# Patient Record
Sex: Female | Born: 1970 | Race: Black or African American | Hispanic: No | Smoking: Former smoker
Health system: Southern US, Community
[De-identification: ages and names within clinical notes are randomized; demographics above are authoritative.]

## PROBLEM LIST (undated history)

## (undated) DIAGNOSIS — E119 Type 2 diabetes mellitus without complications: Secondary | ICD-10-CM

## (undated) DIAGNOSIS — F419 Anxiety disorder, unspecified: Secondary | ICD-10-CM

## (undated) DIAGNOSIS — E785 Hyperlipidemia, unspecified: Secondary | ICD-10-CM

## (undated) DIAGNOSIS — I10 Essential (primary) hypertension: Secondary | ICD-10-CM

## (undated) DIAGNOSIS — E079 Disorder of thyroid, unspecified: Secondary | ICD-10-CM

## (undated) HISTORY — DX: Anxiety disorder, unspecified: F41.9

## (undated) HISTORY — DX: Disorder of thyroid, unspecified: E07.9

## (undated) HISTORY — DX: Hyperlipidemia, unspecified: E78.5

## (undated) HISTORY — PX: BREAST EXCISIONAL BIOPSY: SUR124

## (undated) HISTORY — DX: Type 2 diabetes mellitus without complications: E11.9

## (undated) HISTORY — DX: Essential (primary) hypertension: I10

---

## 1999-10-26 HISTORY — PX: FRACTURE SURGERY: SHX138

## 2002-02-25 ENCOUNTER — Inpatient Hospital Stay (HOSPITAL_COMMUNITY): Admission: EM | Admit: 2002-02-25 | Discharge: 2002-02-26 | Payer: Self-pay | Admitting: *Deleted

## 2002-02-26 ENCOUNTER — Inpatient Hospital Stay (HOSPITAL_COMMUNITY): Admission: EM | Admit: 2002-02-26 | Discharge: 2002-03-01 | Payer: Self-pay | Admitting: Psychiatry

## 2002-04-06 ENCOUNTER — Encounter: Admission: RE | Admit: 2002-04-06 | Discharge: 2002-04-06 | Payer: Self-pay | Admitting: Psychiatry

## 2002-11-13 ENCOUNTER — Emergency Department (HOSPITAL_COMMUNITY): Admission: EM | Admit: 2002-11-13 | Discharge: 2002-11-13 | Payer: Self-pay | Admitting: Emergency Medicine

## 2002-11-13 ENCOUNTER — Encounter: Payer: Self-pay | Admitting: Emergency Medicine

## 2007-10-02 ENCOUNTER — Other Ambulatory Visit: Admission: RE | Admit: 2007-10-02 | Discharge: 2007-10-02 | Payer: Self-pay | Admitting: Obstetrics and Gynecology

## 2008-12-27 ENCOUNTER — Emergency Department (HOSPITAL_COMMUNITY): Admission: EM | Admit: 2008-12-27 | Discharge: 2008-12-27 | Payer: Self-pay | Admitting: Emergency Medicine

## 2009-07-11 ENCOUNTER — Emergency Department (HOSPITAL_COMMUNITY): Admission: EM | Admit: 2009-07-11 | Discharge: 2009-07-11 | Payer: Self-pay | Admitting: Emergency Medicine

## 2010-11-15 ENCOUNTER — Encounter: Payer: Self-pay | Admitting: Obstetrics and Gynecology

## 2011-01-07 ENCOUNTER — Other Ambulatory Visit (HOSPITAL_COMMUNITY): Payer: Self-pay | Admitting: Internal Medicine

## 2011-01-07 DIAGNOSIS — Z139 Encounter for screening, unspecified: Secondary | ICD-10-CM

## 2011-01-29 LAB — BASIC METABOLIC PANEL
BUN: 10 mg/dL (ref 6–23)
CO2: 26 mEq/L (ref 19–32)
Calcium: 9.9 mg/dL (ref 8.4–10.5)
Chloride: 102 mEq/L (ref 96–112)
Creatinine, Ser: 0.82 mg/dL (ref 0.4–1.2)
GFR calc Af Amer: >60 mL/min (ref >60)
GFR calc non Af Amer: >60 mL/min (ref >60)
Glucose, Bld: 99 mg/dL (ref 70–99)
Potassium: 3.8 mEq/L (ref 3.5–5.1)
Sodium: 138 mEq/L (ref 135–145)

## 2011-01-29 LAB — CBC
HCT: 37.7 % (ref 36.0–46.0)
Hemoglobin: 12.6 g/dL (ref 12.0–15.0)
MCHC: 33.4 g/dL (ref 30.0–36.0)
MCV: 84.4 fL (ref 78.0–100.0)
Platelets: 284 10*3/uL (ref 150–400)
RBC: 4.47 MIL/uL (ref 3.87–5.11)
RDW: 14.3 % (ref 11.5–15.5)
WBC: 10.4 10*3/uL (ref 4.0–10.5)

## 2011-01-29 LAB — DIFFERENTIAL
Lymphocytes Relative: 15 % (ref 12–46)
Lymphs Abs: 1.5 10*3/uL (ref 0.7–4.0)
Monocytes Absolute: 0.7 10*3/uL (ref 0.1–1.0)
Monocytes Relative: 6 % (ref 3–12)
Neutro Abs: 8.1 10*3/uL — ABNORMAL HIGH (ref 1.7–7.7)
Neutrophils Relative %: 77 % (ref 43–77)

## 2011-03-12 NOTE — Discharge Summary (Signed)
[facility]  Patient:    Tracy Kelley, Tracy Kelley Visit Number: [id number] MRN: [id number]          Service Type: PSY Location: 500 0500 01 Attending Physician:  Jeanice Lim Dictated by:   Jeanice Lim, M.D. Admit Date:  02/26/2002 Discharge Date: 03/01/2002                             Discharge Summary  IDENTIFYING DATA:  This is a 40 year old divorced African-American female voluntarily admitted on March 5 for depression status post intentional overdose.  This is the second hospitalization at [facility].  ADMISSION MEDICATIONS:  The patient had been off her thyroid medicine and antidepressants for at least six months due to weight gain.  She had been on Paxil and felt "spaced out."  ALLERGIES:  No known drug allergies.  PHYSICAL EXAMINATION:  GENERAL:  Essentially within normal limits.  NEUROLOGIC:  Nonfocal.  ROUTINE ADMISSION LABORATORY DATA:  Urine drug screen: Positive for amphetamines.  Potassium slightly low at 3.2.  TSH 0.527.  MENTAL STATUS EXAMINATION:  Alert, young adult African-American female, neatly dressed, cooperative.  Speech: Within normal limits.  Mood: Depressed. Affect: Sad, superficially bright. Thought process: Goal directed.  Thought content: Negative for acute dangerous ideation or psychotic symptoms. Cognitive: Intact.  Judgment and insight: Fair.  ADMITTING DIAGNOSES: Axis I:    Major depression, status post suicide attempt. Axis II:   None. Axis III:  Hypothyroidism. Axis IV:   Moderate problems with primary support group and economics. Axis V:    30/70  HOSPITAL COURSE:  The patient was admitted and ordered routine p.r.n. medications, underwent monitoring.  Potassium was replaced and she was started on Lexapro which she tolerated.  She showed improvement in mood and participated fully in individual, group, and milieu therapy, responding to clinical intervention.  CONDITION AT DISCHARGE:  Improved.  Mood: Less  depressed.  Affect: Brighter. Thought process: Goal directed.  Thought content: Negative for dangerous ideation or psychotic symptoms.  The patient reported motivation to be compliant with the followup plan.  DISCHARGE MEDICATIONS: 1. Iron 325 mg q.a.m. 2. Ambien 10 mg q.h.s. p.r.n. insomnia. 3. Lexapro 10 mg one half q.a.m.  FOLLOWUP: 1. Dr. Lourdes Sledge on May 26 at 2 p.m. 2. Abel Presto on May 21 at 10 a.m.  DISCHARGE DIAGNOSES: Axis I:    Major depression, status post suicide attempt. Axis II:   None. Axis III:  Hypothyroidism. Axis IV:   Moderate problems with primary support group and economics. Axis V:    Global assessment of functioning on discharge was 55-60. Dictated by:   Jeanice Lim, M.D. Attending Physician:  Jeanice Lim DD:  04/04/02 TD:  04/06/02 Job: [id number]

## 2011-03-12 NOTE — H&P (Signed)
[facility]  Patient:    Tracy Kelley, TOLSMA Visit Number: [id number] MRN: [id number]          Service Type: PSY Location: 500 0500 01 Attending Physician:  Jeanice Lim Dictated by:   Candi Leash. Orsini, N.P. Admit Date:  02/26/2002 Discharge Date: 03/01/2002                     Psychiatric Admission Assessment  DATE OF ADMISSION:  Feb 26, 2002  PATIENT IDENTIFICATION:  This is a 40 year old divorced African-American female voluntarily admitted on December 27, 2001, for depression and intentional overdose.  HISTORY OF PRESENT ILLNESS:  The patient presents with a history of intentional overdose of over-the-counter sleeping pills approximately 20-30 tablets, unsure what the medications name was; also less than 10 Tylenol tablets and a half a box of Benadryl at home on Sunday afternoon.  The patient did it by herself.  She thought about it for some time.  Stated that she wanted to be there alone.  States she was not doing any drinking or drug use. Her intention was "go to sleep and not wake up."  The patient left no note. The patient states that she was thinking about it, as stated, for a few days prior to the overdose.  The patient became very groggy, stated that things were not happening fast enough, then took a razor and cut both her wrists. She felt her heart race increasing, was having a panic attack.  The patient had also locked herself in her home and was unsure how she got to the emergency department.  The patient reports she has been isolating herself with decrease in activities, decreased sleep, decreased appetite.  She feels she does not need any medications at this time.  PAST PSYCHIATRIC HISTORY:  Second hospitalization to [facility]; was hospitalized in 1998 for depression.  No outpatient treatment, no other hospitalizations, no prior suicide attempt.  SUBSTANCE ABUSE HISTORY:  She is a nonsmoker.  Has been using beer lately  to help her sleep two to three a night for the past six months.  Her last drink was a week prior to this admission.  No blackouts or seizures.  The patient states she smokes marijuana on occasion.  PAST MEDICAL HISTORY:  Primary care [facility]: [facility] in [place].  Medical problems: Hypothyroidism.  MEDICATIONS:  The patient has been off her thyroid medications for at least six months due to her weight gain.  Was on Paxil and stated it got her "spaced out."  DRUG ALLERGIES:  No known allergies.  PHYSICAL EXAMINATION:  GENERAL:  Performed at [facility].  The patient appears well-nourished and in no acute distress.  LABORATORY DATA:  Urine drug screen was positive for amphetamines.  Potassium 3, BUN 5.  TSH 0.527. SOCIAL HISTORY:  She is a 40 year old divorced African-American female, divorced for five years.  Has two children ages 15 and 54.  Children are presently with her mother.  She lives with her children.  She is a Training and development officer for [employer].  She has completed one year of college, no legal problems.  FAMILY HISTORY:  Father with anxiety.  MENTAL STATUS EXAMINATION:  She is an alert, young adult African-American female.  She is neat and cooperative.  Speech is normal and relevant.  Mood is depressed.  Affect is sad; the patient tries to be bright.  Thought processes are coherent, goal directed, no evidence of psychosis.  Cognitive: Intact. Memory  is good.  Judgment is fair.  Insight is poor to fair.  The patient appears to have average intelligence.  ADMISSION DIAGNOSES: Axis I:    Major depression with suicide attempt. Axis II:   Deferred. Axis III:  Hypothyroidism. Axis IV:   Problems with primary support group and economics. Axis V:    Current is 30, estimated this past year is 24  INITIAL PLAN OF CARE:  Plan is a voluntary admission to [facility] for depression and suicide attempt.  Contract for safety.  Check  every 15 minutes.  Will obtain labs.  Will replace her potassium, encourage fluids. Have Ambien for sleep.  Attend groups.  Will monitor her lacerations for infections.  Will monitor depressive symptoms.  The patient is to be medication compliant if the patient desires to be on an antidepressant.  ESTIMATED LENGTH OF STAY:  Three to five days. Dictated by:   Candi Leash. Orsini, N.P. Attending Physician:  Jeanice Lim DD:  03/01/02 TD:  03/01/02 Job: [id number]

## 2011-03-12 NOTE — H&P (Signed)
[facility]  Patient:    ANALIS, DISTLER Visit Number: [id number] MRN: [id number]          Service Type: MED Location: ICCU [id number] Attending Physician:  Rosalyn Charters Dictated by:   Vonita Moss, M.D. Admit Date:  02/25/2002                           History and Physical  CHIEF COMPLAINT:  The patient is a 40 year old black female, who was found by family members lying on her bed lethargic and with lacerations to both wrists.  HISTORY OF PRESENT ILLNESS:  EMS was contacted and the patient was brought to the emergency room.  The patient apparently also took a number of over-the-counter sleep aids, Tylenol Sinus, and Benadryl.  The patient is currently awake and alert.  She denies any specific complaints but it is very difficult to get any information from the patient.  The patients mother is at her bedside.  Mother denies that the patient has any history of depression or any history of any prior suicide attempts, although apparently the patient told nursing staff earlier that she had a prior suicide attempt and she has had an admission to [facility] approximately three to four years ago. The patients only report at this time is that "things were piling up on her."  PAST MEDICAL HISTORY:  Hyperthyroid.  Patient states that she has not filled her medications in quite a while, perhaps as long as a year.  CURRENT MEDICATIONS:  None.  ALLERGIES:  None.  SOCIAL HISTORY:  The patient is divorced.  She lives with her two children. She works at [employer].  Currently denies tobacco, alcohol, and drug use.  FAMILY HISTORY:  Positive for mother with hypertension.  REVIEW OF SYSTEMS:  Very limited secondary to poor cooperation by patient but specifically denying chest pain, shortness of breath, nausea, vomiting, abdominal pain, leg pain or swelling, or urinary symptoms.  Her last menstrual period was the first of this month (April 2003) and was  normal.  PHYSICAL EXAMINATION:  GENERAL:  Well-developed, well-nourished female, who appears somewhat diaphoretic.  VITAL SIGNS:  Temperature 99.3 degrees, pulse 137, respirations 20, initial blood pressure 192/97.  O2 saturation 100%.  HEENT:  There is lid lag noted.  Pupils appear equal and reactive to light. Sclerae anicteric.  Conjunctivae not injected.  Extraocular movements were intact.  The patient has an NG tube in place.  Oropharynx is clear.  No oral lesions noted.  NECK:  Supple.  No JVD noted.  Thyroid not tender to palpation.  CHEST/LUNGS:  Clear.  CARDIAC:  Rhythm is regular and tachycardic.  BREAST:  Examination deferred.  ABDOMEN:  Soft.  Bowel sounds present in all four quadrants.  No rebound, guarding, or obvious organomegaly noted.  GU:  Examination deferred.  EXTREMITIES:  Distal pulses intact.  No peripheral edema noted.  NEUROLOGIC:  Nonfocal.  ANCILLARY DATA:  Acetaminophen level is 25.3.  WBC is 9.8, hemoglobin 12.7, platelets 388,000.  Sodium 137, potassium 2.9, chloride 106, CO2 21, BUN 6, glucose 138, creatinine 1.0.  Tox screen was positive for amphetamines.  EKG was sinus tachycardia, ventricular rate 137.  ASSESSMENT/PLAN:  1. Suicide attempt, overdose.  The patients wrists were bandaged in the     emergency room and she did receive tetanus vaccine.  She has had a     nasogastric tube placed and been lavaged with charcoal.  At this time  will     admit to the intensive care unit.  Follow neurologic examinations.     Recheck Tylenol level in four hours, and hydrate with normal saline.  2. History of hyperthyroidism.  Patient currently not taking any     medications per her report.  Her thyroid is not tender, although this     certainly may be contributing to her tachycardia.  Will check TSH and     T4 levels.  Will go ahead and start treatment with Propranolol.  3. Tachycardia.  Question multifactorial in this patient with history of      hyperthyroidism, not on any current medications, and a positive toxicology     screen for amphetamines.  As above, will give Propranolol for now and     hold other medications until TSH and T4 levels are back.  4. Hypokalemia.  Will supplement and follow.  5. Psychiatric.  Will have assessment team evaluate patient in the morning. Dictated by:   Vonita Moss, M.D. Attending Physician:  Rosalyn Charters DD:  02/25/02 TD:  02/26/02 Job: [id number]

## 2011-03-12 NOTE — Discharge Summary (Signed)
[facility]  Patient:    Tracy Kelley, Tracy Kelley Visit Number: [id number] MRN: [id number]          Service Type: PSY Location: 500 0500 01 Attending Physician:  Jeanice Lim Dictated by:   Renne Musca, M.D. Admit Date:  02/26/2002 Discharge Date: 03/01/2002                             Discharge Summary  DIAGNOSES: 1. Suicide attempted by overdose and laceration of wrists. 2. History of hyperthyroidism, euthyroid. 3. Anxiety disorder with depression.  DISPOSITION:  Patient transferred to [facility].  FOLLOWUP:  Dr. Kathrynn Running  HISTORY OF PRESENT ILLNESS:  The patient is a 40 year old African-American female who was found by her family lethargic and lacerations to both wrists. The patient had a previous suicide attempt about three to four years prior and was admitted to [facility].  The patient has had increasing psychosocial stressors, apparently felt it was not worth it anymore, and complained of being "so tired."  The patient apparently gained a significant amount of weight after her initial hospitalization and stopped her psychiatric medications which she could not recall at this time.  In any event, she also has a history of hyperthyroidism which was treated with medication.  No history of iodine ingestion and her TSH at this time is 0.9 which was in the normal limits with a free T4 of 1.4 which was also normal.  In any event, the patient had taken an unknown amount of Tylenol Sinus medication, Benadryl, and an over-the-counter sleep aid.  She was admitted to the intensive care unit for monitoring.  She did have some hypertension, though not hemodynamically significant, as well as sinus tachycardia, again, probably related to sympathomimetic medications she took.  At the time of discharge her heart rate was improved into the 90s with normal blood pressure.  She was evaluated by the ACT team who arranged for transfer to  [facility].  PHYSICAL EXAMINATION  GENERAL:  Alert, quiet African-American female who is in no distress. Well-developed, well-nourished.  VITAL SIGNS:  Blood pressure 130/86, pulse 90 and regular, respirations unlabored.  LUNGS:  Clear to auscultation.  ABDOMEN:  Protuberant, soft, nontender.  EXTREMITIES:  Without clubbing, cyanosis, edema.  She has some superficial excoriations of her wrists.  HEART:  Regular rate and rhythm.  No murmur, gallop, or rub.  The patients status was discussed at length with her family.  The patient will need ongoing support once she returns to the community.  The patient was discharged in stable condition. Dictated by:   Renne Musca, M.D. Attending Physician:  Jeanice Lim DD:  03/07/02 TD:  03/09/02 Job: [id number]

## 2014-05-13 ENCOUNTER — Other Ambulatory Visit (HOSPITAL_COMMUNITY): Payer: Self-pay | Admitting: Family Medicine

## 2014-05-13 DIAGNOSIS — E049 Nontoxic goiter, unspecified: Secondary | ICD-10-CM

## 2014-05-16 ENCOUNTER — Ambulatory Visit (HOSPITAL_COMMUNITY)
Admission: RE | Admit: 2014-05-16 | Discharge: 2014-05-16 | Disposition: A | Discharge status: Home / Self Care | Payer: PRIVATE HEALTH INSURANCE | Source: Ambulatory Visit | Attending: Family Medicine | Admitting: Family Medicine

## 2014-05-16 DIAGNOSIS — E049 Nontoxic goiter, unspecified: Secondary | ICD-10-CM | POA: Insufficient documentation

## 2016-10-07 ENCOUNTER — Telehealth: Payer: Self-pay

## 2016-10-07 NOTE — Telephone Encounter (Signed)
Left message to call back for new pt appt. With dr Meda Coffee.

## 2016-11-04 ENCOUNTER — Ambulatory Visit: Payer: PRIVATE HEALTH INSURANCE | Admitting: Family Medicine

## 2016-11-05 ENCOUNTER — Encounter: Payer: Self-pay | Admitting: Family Medicine

## 2016-11-05 ENCOUNTER — Ambulatory Visit (INDEPENDENT_AMBULATORY_CARE_PROVIDER_SITE_OTHER): Payer: PRIVATE HEALTH INSURANCE | Admitting: Family Medicine

## 2016-11-05 VITALS — BP 132/82 | HR 88 | Temp 98.5°F | Resp 18 | Ht 70.0 in | Wt 198.0 lb

## 2016-11-05 DIAGNOSIS — F5105 Insomnia due to other mental disorder: Secondary | ICD-10-CM

## 2016-11-05 DIAGNOSIS — I10 Essential (primary) hypertension: Secondary | ICD-10-CM | POA: Diagnosis not present

## 2016-11-05 DIAGNOSIS — E119 Type 2 diabetes mellitus without complications: Secondary | ICD-10-CM | POA: Diagnosis not present

## 2016-11-05 DIAGNOSIS — F419 Anxiety disorder, unspecified: Secondary | ICD-10-CM | POA: Insufficient documentation

## 2016-11-05 DIAGNOSIS — Z7689 Persons encountering health services in other specified circumstances: Secondary | ICD-10-CM

## 2016-11-05 DIAGNOSIS — E785 Hyperlipidemia, unspecified: Secondary | ICD-10-CM | POA: Diagnosis not present

## 2016-11-05 LAB — COMPLETE METABOLIC PANEL WITH GFR
ALT: 11 U/L (ref 6–29)
AST: 19 U/L (ref 10–30)
Albumin: 4.3 g/dL (ref 3.6–5.1)
Alkaline Phosphatase: 65 U/L (ref 33–115)
BUN: 7 mg/dL (ref 7–25)
CO2: 32 mmol/L — AB (ref 20–31)
Calcium: 10.3 mg/dL — ABNORMAL HIGH (ref 8.6–10.2)
Chloride: 97 mmol/L — ABNORMAL LOW (ref 98–110)
Creat: 0.71 mg/dL (ref 0.50–1.10)
GFR, Est African American: >89 mL/min (ref >=60)
GLUCOSE: 88 mg/dL (ref 65–99)
Potassium: 3.4 mmol/L — ABNORMAL LOW (ref 3.5–5.3)
SODIUM: 139 mmol/L (ref 135–146)
TOTAL PROTEIN: 7.5 g/dL (ref 6.1–8.1)
Total Bilirubin: 0.4 mg/dL (ref 0.2–1.2)

## 2016-11-05 LAB — CBC
HCT: 36.5 % (ref 35.0–45.0)
Hemoglobin: 12.3 g/dL (ref 11.7–15.5)
MCH: 28.5 pg (ref 27.0–33.0)
MCHC: 33.7 g/dL (ref 32.0–36.0)
MCV: 84.7 fL (ref 80.0–100.0)
MPV: 9.3 fL (ref 7.5–12.5)
PLATELETS: 331 10*3/uL (ref 140–400)
RBC: 4.31 MIL/uL (ref 3.80–5.10)
RDW: 15.1 % — ABNORMAL HIGH (ref 11.0–15.0)
WBC: 8.3 10*3/uL (ref 3.8–10.8)

## 2016-11-05 LAB — HEMOGLOBIN A1C
Hgb A1c MFr Bld: 5.6 % (ref <5.7)
Mean Plasma Glucose: 114 mg/dL

## 2016-11-05 LAB — LIPID PANEL
CHOL/HDL RATIO: 2.6 ratio (ref <5.0)
CHOLESTEROL: 131 mg/dL (ref <200)
HDL: 50 mg/dL — ABNORMAL LOW (ref >50)
LDL Cholesterol: 15 mg/dL (ref <100)
TRIGLYCERIDES: 331 mg/dL — AB (ref <150)
VLDL: 66 mg/dL — ABNORMAL HIGH (ref <30)

## 2016-11-05 LAB — TSH: TSH: 0.88 mIU/L

## 2016-11-05 MED ORDER — TEMAZEPAM 15 MG PO CAPS: 15.0000 mg | ORAL_CAPSULE | Freq: Every evening | ORAL | 0 refills | Status: DC | PRN

## 2016-11-05 MED ORDER — PRAVASTATIN SODIUM 20 MG PO TABS: 20.0000 mg | ORAL_TABLET | Freq: Every day | ORAL | 3 refills | Status: DC

## 2016-11-05 MED ORDER — AMLODIPINE BESYLATE 10 MG PO TABS
10.0000 mg | ORAL_TABLET | Freq: Every day | ORAL | 3 refills | Status: DC
Start: 2016-11-05 — End: 2017-09-05

## 2016-11-05 MED ORDER — SITAGLIPTIN PHOSPHATE 100 MG PO TABS: 100.0000 mg | ORAL_TABLET | Freq: Every day | ORAL | 3 refills | Status: DC

## 2016-11-05 MED ORDER — HYDROCHLOROTHIAZIDE 25 MG PO TABS: 25.0000 mg | ORAL_TABLET | Freq: Every day | ORAL | 3 refills | Status: DC

## 2016-11-05 NOTE — Patient Instructions (Addendum)
Need old record [facility] Need record eye exam  Medicines refilled Labs today I will send you a letter with your test results.  If there is anything of concern, we will call right away.  Try to get back into walking on regular basis  See me in six months
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
## 2016-11-06 LAB — URINALYSIS, ROUTINE W REFLEX MICROSCOPIC
BILIRUBIN URINE: NEGATIVE
GLUCOSE, UA: NEGATIVE
Hgb urine dipstick: NEGATIVE
Ketones, ur: NEGATIVE
Leukocytes, UA: NEGATIVE
Nitrite: NEGATIVE
PH: 7 (ref 5.0–8.0)
Protein, ur: NEGATIVE
SPECIFIC GRAVITY, URINE: 1.011 (ref 1.001–1.035)

## 2016-11-06 LAB — MICROALBUMIN / CREATININE URINE RATIO
Creatinine, Urine: 98 mg/dL (ref 20–320)
Microalb Creat Ratio: 3 mcg/mg creat (ref <30)
Microalb, Ur: 0.3 mg/dL

## 2016-11-06 LAB — VITAMIN D 25 HYDROXY (VIT D DEFICIENCY, FRACTURES): Vit D, 25-Hydroxy: 10 ng/mL — ABNORMAL LOW (ref 30–100)

## 2016-11-08 ENCOUNTER — Encounter: Payer: Self-pay | Admitting: Family Medicine

## 2016-11-08 MED ORDER — VITAMIN D (ERGOCALCIFEROL) 1.25 MG (50000 UNIT) PO CAPS: 50000.0000 [IU] | ORAL_CAPSULE | ORAL | 0 refills | Status: DC

## 2016-12-15 ENCOUNTER — Other Ambulatory Visit: Payer: Self-pay | Admitting: Family Medicine

## 2016-12-15 ENCOUNTER — Other Ambulatory Visit: Payer: Self-pay

## 2016-12-15 MED ORDER — TEMAZEPAM 15 MG PO CAPS: 15.0000 mg | ORAL_CAPSULE | Freq: Every evening | ORAL | 0 refills | Status: DC | PRN

## 2017-02-10 ENCOUNTER — Other Ambulatory Visit: Payer: Self-pay | Admitting: Family Medicine

## 2017-02-14 NOTE — Telephone Encounter (Signed)
Seen 1 12 18   appt 7 12 18

## 2017-02-25 ENCOUNTER — Encounter: Payer: Self-pay | Admitting: Family Medicine

## 2017-02-25 ENCOUNTER — Ambulatory Visit (INDEPENDENT_AMBULATORY_CARE_PROVIDER_SITE_OTHER): Payer: PRIVATE HEALTH INSURANCE | Admitting: Family Medicine

## 2017-02-25 VITALS — BP 118/74 | HR 104 | Temp 98.4°F | Resp 18 | Ht 70.0 in | Wt 193.1 lb

## 2017-02-25 DIAGNOSIS — L237 Allergic contact dermatitis due to plants, except food: Secondary | ICD-10-CM | POA: Diagnosis not present

## 2017-02-25 MED ORDER — METHYLPREDNISOLONE ACETATE 80 MG/ML IJ SUSP
80.0000 mg | Freq: Once | INTRAMUSCULAR | Status: AC
  Administered 2017-02-25: 80 mg via INTRAMUSCULAR

## 2017-02-25 MED ORDER — METHYLPREDNISOLONE ACETATE 80 MG/ML IJ SUSP: 80.0000 mg | Freq: Once | INTRAMUSCULAR | 0 refills | Status: DC

## 2017-02-25 MED ORDER — PREDNISONE 20 MG PO TABS: 20.0000 mg | ORAL_TABLET | Freq: Two times a day (BID) | ORAL | 0 refills | Status: DC

## 2017-02-25 NOTE — Patient Instructions (Signed)
Take the prednisone as directed Take the double dose zyrtec during the day Take the benadryl at night Continue the cortisone cream or anti itch lotion as much as you wish Call for problems

## 2017-02-25 NOTE — Progress Notes (Signed)
    Chief Complaint  Patient presents with  . Rash    since sunday   Rash on left arm, forehead and left abdomen - spreading - since doing yard work. Has used benadryl PO and cortisone cream The rash is swelling and weeping and getting bigger Previous similar reaction to poison ivy  Patient Active Problem List   Diagnosis Date Noted  . Diabetes mellitus type 2 in nonobese ([place]) 11/05/2016  . Essential hypertension 11/05/2016  . Hyperlipemia 11/05/2016  . 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 amLODipine (NORVASC) 10 MG tablet Take 1 tablet (10 mg total) by mouth daily.  . hydrochlorothiazide (HYDRODIURIL) 25 MG tablet Take 1 tablet (25 mg total) by mouth daily.  . pravastatin (PRAVACHOL) 20 MG tablet Take 1 tablet (20 mg total) by mouth daily.  . sitaGLIPtin (JANUVIA) 100 MG tablet Take 1 tablet (100 mg total) by mouth daily.  . temazepam (RESTORIL) 15 MG capsule TAKE 1 TO 2 CAPSULES BY MOUTH AT BEDTIMEAS NEEDED FOR SLEEP.  Marland Kitchen predniSONE (DELTASONE) 20 MG tablet Take 1 tablet (20 mg total) by mouth 2 (two) times daily with a meal. For one week then one a day   No facility-administered encounter medications on file as of 02/25/2017.     Allergies  Allergen Reactions  . Metformin And Related Nausea Only    GI intolerance nausea and diarreha    Review of Systems  Constitutional: Negative for activity change, appetite change and fever.  HENT: Negative for trouble swallowing.   Respiratory: Negative for chest tightness and shortness of breath.   Skin: Positive for rash.  Psychiatric/Behavioral: Positive for sleep disturbance.       Itching    BP 118/74 (BP Location: Right Arm, Patient Position: Sitting, Cuff Size: Normal)   Pulse (!) 104   Temp 98.4 F (36.9 C) (Temporal)   Resp 18   Ht 5\' 10"  (1.778 m)   Wt 193 lb 1.9 oz (87.6 kg)   LMP 02/17/2017 (Approximate)   SpO2 99%   BMI 27.71 kg/m    Physical Exam  Constitutional: She is oriented to person, place, and time. She appears well-developed and well-nourished. No distress.  HENT:  Head: Normocephalic and atraumatic.  Mouth/Throat: Oropharynx is clear and moist.  Eyes: Conjunctivae are normal. Pupils are equal, round, and reactive to light.  Neck: Normal range of motion.  Lymphadenopathy:    She has no cervical adenopathy.  Neurological: She is alert and oriented to person, place, and time.  Skin: Rash noted.  Papulovesicular rash on left forehead and forearm at the elbow.  Lower arm with erythema and soft tissue swelling, weeping serous fluid  Psychiatric: She has a normal mood and affect. Her behavior is normal.    ASSESSMENT/PLAN:  1. Allergic contact dermatitis due to plants, except food Rhus dermatitis - methylPREDNISolone acetate (DEPO-MEDROL) injection 80 mg; Inject 1 mL (80 mg total) into the muscle once.   Patient Instructions  Take the prednisone as directed Take the double dose zyrtec during the day Take the benadryl at night Continue the cortisone cream or anti itch lotion as much as you wish Call for problems    [name], MD

## 2017-03-08 ENCOUNTER — Other Ambulatory Visit: Payer: Self-pay | Admitting: Family Medicine

## 2017-05-05 ENCOUNTER — Ambulatory Visit (INDEPENDENT_AMBULATORY_CARE_PROVIDER_SITE_OTHER): Payer: PRIVATE HEALTH INSURANCE | Admitting: Family Medicine

## 2017-05-05 ENCOUNTER — Encounter: Payer: Self-pay | Admitting: Family Medicine

## 2017-05-05 ENCOUNTER — Other Ambulatory Visit: Payer: Self-pay | Admitting: Family Medicine

## 2017-05-05 VITALS — BP 130/78 | HR 96 | Temp 97.9°F | Resp 18 | Ht 70.0 in | Wt 191.1 lb

## 2017-05-05 DIAGNOSIS — I10 Essential (primary) hypertension: Secondary | ICD-10-CM | POA: Diagnosis not present

## 2017-05-05 DIAGNOSIS — E049 Nontoxic goiter, unspecified: Secondary | ICD-10-CM | POA: Diagnosis not present

## 2017-05-05 DIAGNOSIS — Z1231 Encounter for screening mammogram for malignant neoplasm of breast: Secondary | ICD-10-CM

## 2017-05-05 DIAGNOSIS — E119 Type 2 diabetes mellitus without complications: Secondary | ICD-10-CM | POA: Diagnosis not present

## 2017-05-05 DIAGNOSIS — E040 Nontoxic diffuse goiter: Secondary | ICD-10-CM

## 2017-05-05 NOTE — Patient Instructions (Addendum)
Come back within 3 months for a PAP and PE Get the ultrasound and mammogram at [facility] - these can be scheduled same day Get blood work same day as x rays Your BP is great Continue to eat well and exercise Get me a copy of your eye exam when you go

## 2017-05-05 NOTE — Progress Notes (Signed)
Chief Complaint  Patient presents with  . Follow-up    6 month   Feels well Wants to lose weight.  Discussed diet and exercise. Sugars are good BP is good Says some times her 'thyroid" hurts and points to ant neck.  She has a goiter.  Last [place] was 2015.  Will repeat TFT and ultrsound Vi d def discussed.  She took her vitamin D 50000 u a week for 12 weeks then stopped.  reminded to get OTC vitamin D 2000 u daily  Patient Active Problem List   Diagnosis Date Noted  . Goiter diffuse 05/05/2017  . Diabetes mellitus type 2 in nonobese ([place]) 11/05/2016  . Essential hypertension 11/05/2016  . Hyperlipemia 11/05/2016  . Insomnia secondary to anxiety 11/05/2016    Outpatient Encounter Prescriptions as of 05/05/2017  Medication Sig  . amLODipine (NORVASC) 10 MG tablet Take 1 tablet (10 mg total) by mouth daily.  . hydrochlorothiazide (HYDRODIURIL) 25 MG tablet Take 1 tablet (25 mg total) by mouth daily.  . pravastatin (PRAVACHOL) 20 MG tablet Take 1 tablet (20 mg total) by mouth daily.  . sitaGLIPtin (JANUVIA) 100 MG tablet Take 1 tablet (100 mg total) by mouth daily.  . temazepam (RESTORIL) 15 MG capsule TAKE 1 TO 2 CAPSULES AT BEDTIME AS NEEDED FOR SLEEP   No facility-administered encounter medications on file as of 05/05/2017.     Allergies  Allergen Reactions  . Metformin And Related Nausea Only    GI intolerance nausea and diarreha    Review of Systems  Constitutional: Negative for activity change, appetite change and unexpected weight change.  HENT: Negative for congestion, dental problem, postnasal drip and rhinorrhea.   Eyes: Negative for redness and visual disturbance.  Respiratory: Negative for cough and shortness of breath.   Cardiovascular: Negative for chest pain, palpitations and leg swelling.  Gastrointestinal: Negative for abdominal pain, constipation and diarrhea.  Genitourinary: Negative for difficulty urinating, frequency and menstrual problem.    Musculoskeletal: Positive for neck pain. Negative for arthralgias and back pain.       "Thyroid"  Neurological: Negative for dizziness and headaches.  Psychiatric/Behavioral: Positive for sleep disturbance. Negative for dysphoric mood. The patient is not nervous/anxious.     BP 130/78 (BP Location: Right Arm, Patient Position: Sitting, Cuff Size: Normal)   Pulse 96   Temp 97.9 F (36.6 C) (Temporal)   Resp 18   Ht 5\' 10"  (1.778 m)   Wt 191 lb 1.3 oz (86.7 kg)   LMP 04/27/2017 (Exact Date)   SpO2 98%   BMI 27.42 kg/m   Physical Exam  Constitutional: She is oriented to person, place, and time. She appears well-developed and well-nourished. No distress.  HENT:  Head: Normocephalic and atraumatic.  Mouth/Throat: Oropharynx is clear and moist.  Eyes: Pupils are equal, round, and reactive to light. Conjunctivae are normal.  Neck: Normal range of motion. Thyromegaly present.  Cardiovascular: Normal rate, regular rhythm and normal heart sounds.   Pulmonary/Chest: Effort normal and breath sounds normal.  Lymphadenopathy:    She has no cervical adenopathy.  Neurological: She is alert and oriented to person, place, and time. She displays normal reflexes.  Skin: Skin is warm and dry. No rash noted.  Psychiatric: She has a normal mood and affect. Her behavior is normal. Thought content normal.    ASSESSMENT/PLAN:  1. Goiter diffuse  2. Diabetes mellitus type 2 in nonobese ([facility]) - CBC - COMPLETE METABOLIC PANEL WITH GFR - Hemoglobin A1c - Lipid  panel - VITAMIN D 25 Hydroxy (Vit-D Deficiency, Fractures) - Microalbumin / creatinine urine ratio - Urinalysis, Routine w reflex microscopic - TSH - US THYROID; Future - MM Digital Screening; Future  3. Essential hypertension    Patient Instructions  Come back within 3 months for a PAP and PE Get the ultrasound and mammogram at [facility] - these can be scheduled same day Get blood work same day as x rays Your BP is  great Continue to eat well and exercise Get me a copy of your eye exam when you go   [name], MD

## 2017-05-10 ENCOUNTER — Other Ambulatory Visit (HOSPITAL_COMMUNITY): Payer: PRIVATE HEALTH INSURANCE

## 2017-05-11 ENCOUNTER — Ambulatory Visit (HOSPITAL_COMMUNITY)
Admission: RE | Admit: 2017-05-11 | Discharge: 2017-05-11 | Disposition: A | Discharge status: Home / Self Care | Payer: PRIVATE HEALTH INSURANCE | Source: Ambulatory Visit | Attending: Family Medicine | Admitting: Family Medicine

## 2017-05-11 DIAGNOSIS — E119 Type 2 diabetes mellitus without complications: Secondary | ICD-10-CM | POA: Insufficient documentation

## 2017-05-11 DIAGNOSIS — Z1231 Encounter for screening mammogram for malignant neoplasm of breast: Secondary | ICD-10-CM | POA: Insufficient documentation

## 2017-05-11 DIAGNOSIS — E040 Nontoxic diffuse goiter: Secondary | ICD-10-CM

## 2017-05-11 DIAGNOSIS — I10 Essential (primary) hypertension: Secondary | ICD-10-CM

## 2017-05-11 DIAGNOSIS — E049 Nontoxic goiter, unspecified: Secondary | ICD-10-CM | POA: Diagnosis present

## 2017-05-11 DIAGNOSIS — E042 Nontoxic multinodular goiter: Secondary | ICD-10-CM | POA: Diagnosis not present

## 2017-05-11 LAB — CBC
HCT: 37.7 % (ref 35.0–45.0)
Hemoglobin: 12.5 g/dL (ref 11.7–15.5)
MCH: 27.7 pg (ref 27.0–33.0)
MCHC: 33.2 g/dL (ref 32.0–36.0)
MCV: 83.6 fL (ref 80.0–100.0)
MPV: 9.5 fL (ref 7.5–12.5)
PLATELETS: 349 10*3/uL (ref 140–400)
RBC: 4.51 MIL/uL (ref 3.80–5.10)
RDW: 14.4 % (ref 11.0–15.0)
WBC: 6.5 10*3/uL (ref 3.8–10.8)

## 2017-05-12 ENCOUNTER — Encounter: Payer: Self-pay | Admitting: Family Medicine

## 2017-05-12 LAB — LIPID PANEL
CHOL/HDL RATIO: 2.9 ratio (ref <5.0)
Cholesterol: 138 mg/dL (ref <200)
HDL: 47 mg/dL — ABNORMAL LOW (ref >50)
LDL Cholesterol: 18 mg/dL (ref <100)
Triglycerides: 363 mg/dL — ABNORMAL HIGH (ref <150)
VLDL: 73 mg/dL — AB (ref <30)

## 2017-05-12 LAB — COMPLETE METABOLIC PANEL WITH GFR
ALT: 9 U/L (ref 6–29)
AST: 12 U/L (ref 10–30)
Albumin: 4.2 g/dL (ref 3.6–5.1)
Alkaline Phosphatase: 54 U/L (ref 33–115)
BILIRUBIN TOTAL: 0.4 mg/dL (ref 0.2–1.2)
BUN: 9 mg/dL (ref 7–25)
CO2: 30 mmol/L (ref 20–31)
CREATININE: 0.82 mg/dL (ref 0.50–1.10)
Calcium: 9.5 mg/dL (ref 8.6–10.2)
Chloride: 99 mmol/L (ref 98–110)
GFR, Est Non African American: 87 mL/min (ref >=60)
GLUCOSE: 97 mg/dL (ref 65–99)
Potassium: 3.5 mmol/L (ref 3.5–5.3)
Sodium: 140 mmol/L (ref 135–146)
Total Protein: 7 g/dL (ref 6.1–8.1)

## 2017-05-12 LAB — URINALYSIS, ROUTINE W REFLEX MICROSCOPIC
BILIRUBIN URINE: NEGATIVE
Glucose, UA: NEGATIVE
Hgb urine dipstick: NEGATIVE
KETONES UR: NEGATIVE
Leukocytes, UA: NEGATIVE
Nitrite: NEGATIVE
PH: 7.5 (ref 5.0–8.0)
Protein, ur: NEGATIVE
SPECIFIC GRAVITY, URINE: 1.015 (ref 1.001–1.035)

## 2017-05-12 LAB — MICROALBUMIN / CREATININE URINE RATIO
CREATININE, URINE: 269 mg/dL (ref 20–320)
Microalb Creat Ratio: 4 mcg/mg creat (ref <30)
Microalb, Ur: 1 mg/dL

## 2017-05-12 LAB — VITAMIN D 25 HYDROXY (VIT D DEFICIENCY, FRACTURES): VIT D 25 HYDROXY: 15 ng/mL — AB (ref 30–100)

## 2017-05-12 LAB — TSH: TSH: 0.75 mIU/L

## 2017-05-12 LAB — HEMOGLOBIN A1C
Hgb A1c MFr Bld: 5.5 % (ref <5.7)
MEAN PLASMA GLUCOSE: 111 mg/dL

## 2017-05-23 ENCOUNTER — Telehealth: Payer: Self-pay | Admitting: Family Medicine

## 2017-05-23 ENCOUNTER — Other Ambulatory Visit: Payer: Self-pay | Admitting: Family Medicine

## 2017-05-23 MED ORDER — PREDNISONE 20 MG PO TABS: ORAL_TABLET | ORAL | 0 refills | Status: DC

## 2017-05-23 NOTE — Telephone Encounter (Signed)
Rash fronm poison oak started 3 days ago, now on right arm, neck an d below left eye, no  Difficulty p breathing , deltasone prescribed pt to call for appt with pCP if needed, denies breathing difficulty

## 2017-05-23 NOTE — Telephone Encounter (Signed)
Patient requesting Z-pack for poison ivy (on neck & arms).  Call into [facility].

## 2017-05-23 NOTE — Telephone Encounter (Signed)
Attempted tpo speak with ot for more detail, had to leave a msg , advised her pCp is in tomorrow, and if she needs to may call me after hrs

## 2017-05-23 NOTE — Telephone Encounter (Signed)
Patient called stating it may not have been a z-pack she got for it last time, patient states she needs "whatever it was that Dr Meda Coffee prescribed her back in may". Please advise

## 2017-05-24 ENCOUNTER — Telehealth: Payer: Self-pay | Admitting: *Deleted

## 2017-05-24 MED ORDER — PREDNISONE 5 MG (21) PO TBPK: ORAL_TABLET | Freq: Every day | ORAL | 0 refills | Status: DC

## 2017-05-24 NOTE — Telephone Encounter (Signed)
[facility] called regarding predizone, they stated Dr Moshe Cipro sent a prescription yesterday for it and Dr Meda Coffee sent one today. Please call [place] [phone]

## 2017-05-24 NOTE — Telephone Encounter (Signed)
May have medrol dosepak, take as directed, #21 pils

## 2017-05-24 NOTE — Telephone Encounter (Signed)
Spoke to [employer]. Clarified orders.

## 2017-05-25 ENCOUNTER — Other Ambulatory Visit: Payer: Self-pay | Admitting: Family Medicine

## 2017-05-25 ENCOUNTER — Encounter: Payer: Self-pay | Admitting: Family Medicine

## 2017-05-25 MED ORDER — GLIPIZIDE 5 MG PO TABS: 5.0000 mg | ORAL_TABLET | Freq: Every day | ORAL | 3 refills | Status: DC

## 2017-05-27 ENCOUNTER — Other Ambulatory Visit: Payer: Self-pay | Admitting: Family Medicine

## 2017-05-27 MED ORDER — GLIPIZIDE 5 MG PO TABS: 5.0000 mg | ORAL_TABLET | Freq: Every day | ORAL | 3 refills | Status: DC

## 2017-06-04 ENCOUNTER — Emergency Department (HOSPITAL_COMMUNITY): Payer: PRIVATE HEALTH INSURANCE

## 2017-06-04 ENCOUNTER — Encounter (HOSPITAL_COMMUNITY): Payer: Self-pay | Admitting: Emergency Medicine

## 2017-06-04 ENCOUNTER — Observation Stay (HOSPITAL_COMMUNITY)
Admission: EM | Admit: 2017-06-04 | Discharge: 2017-06-06 | Disposition: A | Discharge status: Home / Self Care | Payer: PRIVATE HEALTH INSURANCE | Attending: Family Medicine | Admitting: Family Medicine

## 2017-06-04 DIAGNOSIS — N3000 Acute cystitis without hematuria: Secondary | ICD-10-CM | POA: Diagnosis not present

## 2017-06-04 DIAGNOSIS — K529 Noninfective gastroenteritis and colitis, unspecified: Secondary | ICD-10-CM | POA: Diagnosis not present

## 2017-06-04 DIAGNOSIS — I10 Essential (primary) hypertension: Secondary | ICD-10-CM | POA: Diagnosis not present

## 2017-06-04 DIAGNOSIS — A419 Sepsis, unspecified organism: Secondary | ICD-10-CM | POA: Diagnosis present

## 2017-06-04 DIAGNOSIS — E119 Type 2 diabetes mellitus without complications: Secondary | ICD-10-CM | POA: Diagnosis not present

## 2017-06-04 DIAGNOSIS — Z7984 Long term (current) use of oral hypoglycemic drugs: Secondary | ICD-10-CM | POA: Diagnosis not present

## 2017-06-04 DIAGNOSIS — R1030 Lower abdominal pain, unspecified: Secondary | ICD-10-CM | POA: Diagnosis present

## 2017-06-04 DIAGNOSIS — D72829 Elevated white blood cell count, unspecified: Secondary | ICD-10-CM | POA: Diagnosis present

## 2017-06-04 DIAGNOSIS — N390 Urinary tract infection, site not specified: Secondary | ICD-10-CM | POA: Diagnosis present

## 2017-06-04 DIAGNOSIS — Z79899 Other long term (current) drug therapy: Secondary | ICD-10-CM | POA: Insufficient documentation

## 2017-06-04 LAB — CBC WITH DIFFERENTIAL/PLATELET
BASOS PCT: 0 %
Basophils Absolute: 0 10*3/uL (ref 0.0–0.1)
EOS ABS: 0 10*3/uL (ref 0.0–0.7)
EOS PCT: 0 %
HCT: 35.5 % — ABNORMAL LOW (ref 36.0–46.0)
HEMOGLOBIN: 11.8 g/dL — AB (ref 12.0–15.0)
LYMPHS ABS: 2.1 10*3/uL (ref 0.7–4.0)
Lymphocytes Relative: 10 %
MCH: 27.8 pg (ref 26.0–34.0)
MCHC: 33.2 g/dL (ref 30.0–36.0)
MCV: 83.5 fL (ref 78.0–100.0)
MONO ABS: 0.5 10*3/uL (ref 0.1–1.0)
MONOS PCT: 2 %
NEUTROS PCT: 88 %
Neutro Abs: 19.8 10*3/uL — ABNORMAL HIGH (ref 1.7–7.7)
PLATELETS: 277 10*3/uL (ref 150–400)
RBC: 4.25 MIL/uL (ref 3.87–5.11)
RDW: 15.4 % (ref 11.5–15.5)
WBC: 22.4 10*3/uL — ABNORMAL HIGH (ref 4.0–10.5)

## 2017-06-04 LAB — COMPREHENSIVE METABOLIC PANEL
ALBUMIN: 3.8 g/dL (ref 3.5–5.0)
ALT: 36 U/L (ref 14–54)
ANION GAP: 12 (ref 5–15)
AST: 25 U/L (ref 15–41)
Alkaline Phosphatase: 54 U/L (ref 38–126)
BUN: 11 mg/dL (ref 6–20)
CALCIUM: 9.1 mg/dL (ref 8.9–10.3)
CHLORIDE: 98 mmol/L — AB (ref 101–111)
CO2: 25 mmol/L (ref 22–32)
Creatinine, Ser: 0.68 mg/dL (ref 0.44–1.00)
GFR calc non Af Amer: >60 mL/min (ref >60)
GLUCOSE: 162 mg/dL — AB (ref 65–99)
POTASSIUM: 3.1 mmol/L — AB (ref 3.5–5.1)
SODIUM: 135 mmol/L (ref 135–145)
Total Bilirubin: 1.2 mg/dL (ref 0.3–1.2)
Total Protein: 7.2 g/dL (ref 6.5–8.1)

## 2017-06-04 LAB — PREGNANCY, URINE: Preg Test, Ur: NEGATIVE

## 2017-06-04 LAB — CBG MONITORING, ED: Glucose-Capillary: 159 mg/dL — ABNORMAL HIGH (ref 65–99)

## 2017-06-04 LAB — URINALYSIS, ROUTINE W REFLEX MICROSCOPIC
Bilirubin Urine: NEGATIVE
GLUCOSE, UA: NEGATIVE mg/dL
HGB URINE DIPSTICK: NEGATIVE
KETONES UR: NEGATIVE mg/dL
NITRITE: NEGATIVE
PH: 7 (ref 5.0–8.0)
Protein, ur: NEGATIVE mg/dL
Specific Gravity, Urine: 1.014 (ref 1.005–1.030)

## 2017-06-04 LAB — I-STAT CG4 LACTIC ACID, ED
Lactic Acid, Venous: 3 mmol/L (ref 0.5–1.9)
Lactic Acid, Venous: 1.53 mmol/L (ref 0.5–1.9)

## 2017-06-04 LAB — LACTIC ACID, PLASMA: Lactic Acid, Venous: 0.7 mmol/L (ref 0.5–1.9)

## 2017-06-04 MED ORDER — OXYCODONE HCL 5 MG PO TABS
5.0000 mg | ORAL_TABLET | ORAL | Status: DC | PRN
  Administered 2017-06-04 – 2017-06-05 (×3): 5 mg via ORAL
  Filled 2017-06-04 (×3): qty 1

## 2017-06-04 MED ORDER — ACETAMINOPHEN 650 MG RE SUPP: 650.0000 mg | Freq: Four times a day (QID) | RECTAL | Status: DC | PRN

## 2017-06-04 MED ORDER — HYDROMORPHONE HCL 1 MG/ML IJ SOLN
1.0000 mg | Freq: Once | INTRAMUSCULAR | Status: AC
  Administered 2017-06-04: 1 mg via INTRAVENOUS
  Filled 2017-06-04: qty 1

## 2017-06-04 MED ORDER — METRONIDAZOLE IN NACL 5-0.79 MG/ML-% IV SOLN
500.0000 mg | Freq: Three times a day (TID) | INTRAVENOUS | Status: DC
  Administered 2017-06-05 – 2017-06-06 (×5): 500 mg via INTRAVENOUS
  Filled 2017-06-04 (×5): qty 100

## 2017-06-04 MED ORDER — ONDANSETRON HCL 4 MG/2ML IJ SOLN
4.0000 mg | Freq: Once | INTRAMUSCULAR | Status: AC
  Administered 2017-06-04: 4 mg via INTRAVENOUS
  Filled 2017-06-04: qty 2

## 2017-06-04 MED ORDER — IOPAMIDOL (ISOVUE-300) INJECTION 61%
100.0000 mL | Freq: Once | INTRAVENOUS | Status: AC | PRN
  Administered 2017-06-04: 100 mL via INTRAVENOUS

## 2017-06-04 MED ORDER — SODIUM CHLORIDE 0.9 % IV SOLN
INTRAVENOUS | Status: DC
  Administered 2017-06-05 – 2017-06-06 (×4): via INTRAVENOUS

## 2017-06-04 MED ORDER — DEXTROSE 5 % IV SOLN
1.0000 g | INTRAVENOUS | Status: DC
  Administered 2017-06-05: 1 g via INTRAVENOUS
  Filled 2017-06-04 (×3): qty 10

## 2017-06-04 MED ORDER — HEPARIN SODIUM (PORCINE) 5000 UNIT/ML IJ SOLN
5000.0000 [IU] | Freq: Three times a day (TID) | INTRAMUSCULAR | Status: DC
  Administered 2017-06-04 – 2017-06-06 (×5): 5000 [IU] via SUBCUTANEOUS
  Filled 2017-06-04 (×5): qty 1

## 2017-06-04 MED ORDER — CEFTRIAXONE SODIUM 1 G IJ SOLR
INTRAMUSCULAR | Status: AC
  Filled 2017-06-04: qty 10

## 2017-06-04 MED ORDER — SODIUM CHLORIDE 0.9 % IV BOLUS (SEPSIS)
1000.0000 mL | Freq: Once | INTRAVENOUS | Status: AC
  Administered 2017-06-04: 1000 mL via INTRAVENOUS

## 2017-06-04 MED ORDER — ZOLPIDEM TARTRATE 5 MG PO TABS
5.0000 mg | ORAL_TABLET | Freq: Every day | ORAL | Status: DC
  Administered 2017-06-04: 5 mg via ORAL
  Filled 2017-06-04: qty 1

## 2017-06-04 MED ORDER — FENTANYL CITRATE (PF) 100 MCG/2ML IJ SOLN
100.0000 ug | Freq: Once | INTRAMUSCULAR | Status: AC
  Administered 2017-06-04: 100 ug via INTRAVENOUS
  Filled 2017-06-04: qty 2

## 2017-06-04 MED ORDER — ACETAMINOPHEN 325 MG PO TABS: 650.0000 mg | ORAL_TABLET | Freq: Four times a day (QID) | ORAL | Status: DC | PRN

## 2017-06-04 NOTE — ED Notes (Signed)
Patient transported to CT 

## 2017-06-04 NOTE — ED Notes (Signed)
Date and time results received: 06/04/17 1324 (use smartphrase ".now" to insert current time)  Test: ISTAT Lactic Acid Critical Value: 3.0  Name of Provider Notified: Sabra Heck  Orders Received? Or Actions Taken?: Orders Received - See Orders for details
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
## 2017-06-04 NOTE — ED Triage Notes (Signed)
Onset last night abdominal pain, BM last night, denies vomiting or diarrhea, pt is have severe lower sharp abdominal pain today.
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
## 2017-06-05 DIAGNOSIS — N3000 Acute cystitis without hematuria: Secondary | ICD-10-CM | POA: Diagnosis not present

## 2017-06-05 LAB — COMPREHENSIVE METABOLIC PANEL
ALK PHOS: 45 U/L (ref 38–126)
ALT: 25 U/L (ref 14–54)
ANION GAP: 8 (ref 5–15)
AST: 11 U/L — ABNORMAL LOW (ref 15–41)
Albumin: 3.1 g/dL — ABNORMAL LOW (ref 3.5–5.0)
BILIRUBIN TOTAL: 1.1 mg/dL (ref 0.3–1.2)
BUN: 9 mg/dL (ref 6–20)
CALCIUM: 8.1 mg/dL — AB (ref 8.9–10.3)
CO2: 26 mmol/L (ref 22–32)
CREATININE: 0.58 mg/dL (ref 0.44–1.00)
Chloride: 105 mmol/L (ref 101–111)
GFR calc non Af Amer: >60 mL/min (ref >60)
GLUCOSE: 96 mg/dL (ref 65–99)
Potassium: 3.1 mmol/L — ABNORMAL LOW (ref 3.5–5.1)
Sodium: 139 mmol/L (ref 135–145)
TOTAL PROTEIN: 6.4 g/dL — AB (ref 6.5–8.1)

## 2017-06-05 LAB — CBC
HCT: 31.5 % — ABNORMAL LOW (ref 36.0–46.0)
HEMOGLOBIN: 10.4 g/dL — AB (ref 12.0–15.0)
MCH: 27.8 pg (ref 26.0–34.0)
MCHC: 33 g/dL (ref 30.0–36.0)
MCV: 84.2 fL (ref 78.0–100.0)
PLATELETS: 216 10*3/uL (ref 150–400)
RBC: 3.74 MIL/uL — ABNORMAL LOW (ref 3.87–5.11)
RDW: 15.3 % (ref 11.5–15.5)
WBC: 18.1 10*3/uL — ABNORMAL HIGH (ref 4.0–10.5)

## 2017-06-05 LAB — GLUCOSE, CAPILLARY
GLUCOSE-CAPILLARY: 99 mg/dL (ref 65–99)
GLUCOSE-CAPILLARY: 141 mg/dL — AB (ref 65–99)
GLUCOSE-CAPILLARY: 96 mg/dL (ref 65–99)
Glucose-Capillary: 109 mg/dL — ABNORMAL HIGH (ref 65–99)
Glucose-Capillary: 85 mg/dL (ref 65–99)

## 2017-06-05 LAB — LACTIC ACID, PLASMA: LACTIC ACID, VENOUS: 0.5 mmol/L (ref 0.5–1.9)

## 2017-06-05 MED ORDER — ALPRAZOLAM 0.25 MG PO TABS
0.2500 mg | ORAL_TABLET | Freq: Two times a day (BID) | ORAL | Status: DC
  Administered 2017-06-05 – 2017-06-06 (×3): 0.25 mg via ORAL
  Filled 2017-06-05 (×3): qty 1

## 2017-06-05 MED ORDER — POTASSIUM CHLORIDE 20 MEQ PO PACK
40.0000 meq | PACK | Freq: Every day | ORAL | Status: DC
  Administered 2017-06-05 – 2017-06-06 (×2): 40 meq via ORAL
  Filled 2017-06-05 (×2): qty 2

## 2017-06-05 MED ORDER — TEMAZEPAM 15 MG PO CAPS
15.0000 mg | ORAL_CAPSULE | Freq: Every evening | ORAL | Status: DC | PRN
  Administered 2017-06-05: 15 mg via ORAL
  Filled 2017-06-05: qty 1

## 2017-06-05 MED ORDER — MORPHINE SULFATE (PF) 2 MG/ML IV SOLN: 1.0000 mg | Freq: Four times a day (QID) | INTRAVENOUS | Status: DC | PRN

## 2017-06-05 MED ORDER — INSULIN ASPART 100 UNIT/ML ~~LOC~~ SOLN
0.0000 [IU] | SUBCUTANEOUS | Status: DC
  Administered 2017-06-05 – 2017-06-06 (×2): 1 [IU] via SUBCUTANEOUS

## 2017-06-05 NOTE — Progress Notes (Signed)
Patient repots no nausea/vomiting during the night.  Reports abdominal pain.  Patient requesting ice chips.  Dr. Verlon Au notified.

## 2017-06-05 NOTE — Progress Notes (Signed)
PROGRESS NOTE    Tracy Kelley  WUJ:[id number] DOB: 17-Jul-1971 DOA: 06/04/2017 PCP: Tracy Everts, MD  Outpatient Specialists:     Brief Narrative:  46 year old female Prior history of SI?-Prior hospitalization [place] medication Hypothyroidism with goiter-last ultrasound 2016 Well-controlled diabetes mellitus-last A1c 7 Documented noncompliance with medical therapy in the past  Admit 06/04/2017 severe lower abdominal pain 2-3 episodes watery diarrhea, no ill contacts On presentation to ED, MAXIMUM TEMPERATURE 100.3, white count 22 CT abdomen pelvis = diffuse enteritis   Assessment & Plan:   Active Problems:   Sepsis ([place])   UTI (urinary tract infection)   Enteritis   Leukocytosis   Sepsis Unclear how urine was collected-probably asymptomatic bacteriuria Enteritis Rx with ceftriaxone as well as Flagyl-white count has improved from 22-->18 Ice chips/sips of fluid only--- as patient is feeling better we will graduate to clear liquid diet Hold on GI consult at this time-pain control with OxyIR 5 mg every 4 when necessary, breakthrough med morphine 1-2 mg every 6 when necessary for severe pain C. difficile panel pending, HIV pending  DM ty II-last A1c 5.5 04/2017 Holding home dose Glucotrol 5 mg daily, Januvia 100 daily Sliding scale coverage every 4 hourly as nothing by mouth  HTN Holding amlodipine 10 daily, HCTZ 25 daily Cover blood pressure >160 with hydralazine 10 mg IV every 4 when necessary  Prior suicidal ideation/bipolar type I Usually on Restoril 1-2 capsules daily at bedtime Resume the same Discontinue Ambien started this admission  Hypokalemia Potassium is 3.1 Replace with K Dur 40 once daily  Hypothyroidism with history of goiter Last TSH 0.75 Needs outpatient adjustment and recheck in about 3-4 weeks ideally   Csultants:    none currently  Procedures:    none currently  Antimicrobials:    ceftriaxone  Flagyl     Sub Patient is doing fair She is anxious to eat and go home Her abdomen is soft nontender she does not have any guarding I explained to her over 5 minutes  enteritis is something we need to watch and she still seems to be insistent on going home  Objective: Vitals:   06/04/17 1700 06/04/17 1853 06/04/17 2239 06/05/17 0500  BP: (!) 133/92 125/79 120/77 125/74  Pulse: (!) 108 (!) 109 (!) 105 (!) 109  Resp:  20 20 18   Temp:  98.4 F (36.9 C) 99.4 F (37.4 C) 99.1 F (37.3 C)  TempSrc:  Oral Oral Oral  SpO2: 99% 98% 98% 98%  Weight:  89.5 kg (197 lb 6.4 oz)    Height:  5\' 9"  (1.753 m)      Intake/Output Summary (Last 24 hours) at 06/05/17 0748 Last data filed at 06/05/17 0700  Gross per 24 hour  Intake          1589.58 ml  Output              800 ml  Net           789.58 ml   Filed Weights   06/04/17 1254 06/04/17 1853  Weight: 83.9 kg (185 lb) 89.5 kg (197 lb 6.4 oz)    Examination:  Alert pleasant anxious Mallampati 3 S1-S2 no murmur rub or gallop Abdomen soft slight tenderness lower quadrant , somewhat distended  Bowel sounds are increased No lower extremity edema Chest is clear Neurologically intact     Data Reviewed: I have personally reviewed following labs and imaging studies  CBC:  Recent Labs Lab 06/04/17 1333 06/05/17  0244  WBC 22.4* 18.1*  NEUTROABS 19.8*  --   HGB 11.8* 10.4*  HCT 35.5* 31.5*  MCV 83.5 84.2  PLT 277 263   Basic Metabolic Panel:  Recent Labs Lab 06/04/17 1333 06/05/17 0244  NA 135 139  K 3.1* 3.1*  CL 98* 105  CO2 25 26  GLUCOSE 162* 96  BUN 11 9  CREATININE 0.68 0.58  CALCIUM 9.1 8.1*   GFR: Estimated Creatinine Clearance: 105.8 mL/min (by C-G formula based on SCr of 0.58 mg/dL). Liver Function Tests:  Recent Labs Lab 06/04/17 1333 06/05/17 0244  AST 25 11*  ALT 36 25  ALKPHOS 54 45  BILITOT 1.2 1.1  PROT 7.2 6.4*  ALBUMIN 3.8 3.1*   No results for input(s): LIPASE, AMYLASE in the last 168  hours. No results for input(s): AMMONIA in the last 168 hours. Coagulation Profile: No results for input(s): INR, PROTIME in the last 168 hours. Cardiac Enzymes: No results for input(s): CKTOTAL, CKMB, CKMBINDEX, TROPONINI in the last 168 hours. BNP (last 3 results) No results for input(s): PROBNP in the last 8760 hours. HbA1C: No results for input(s): HGBA1C in the last 72 hours. CBG:  Recent Labs Lab 06/04/17 1306  GLUCAP 159*   Lipid Profile: No results for input(s): CHOL, HDL, LDLCALC, TRIG, CHOLHDL, LDLDIRECT in the last 72 hours. Thyroid Function Tests: No results for input(s): TSH, T4TOTAL, FREET4, T3FREE, THYROIDAB in the last 72 hours. Anemia Panel: No results for input(s): VITAMINB12, FOLATE, FERRITIN, TIBC, IRON, RETICCTPCT in the last 72 hours. Urine analysis:    Component Value Date/Time   COLORURINE YELLOW 06/04/2017 1410   APPEARANCEUR HAZY (A) 06/04/2017 1410   LABSPEC 1.014 06/04/2017 1410   PHURINE 7.0 06/04/2017 1410   GLUCOSEU NEGATIVE 06/04/2017 1410   HGBUR NEGATIVE 06/04/2017 1410   BILIRUBINUR NEGATIVE 06/04/2017 1410   KETONESUR NEGATIVE 06/04/2017 1410   PROTEINUR NEGATIVE 06/04/2017 1410   NITRITE NEGATIVE 06/04/2017 1410   LEUKOCYTESUR LARGE (A) 06/04/2017 1410   Sepsis Labs: @LABRCNTIP (procalcitonin:4,lacticidven:4)  )No results found for this or any previous visit (from the past 240 hour(s)).       Radiology Studies: Ct Abdomen Pelvis W Contrast  Result Date: 06/04/2017 CLINICAL DATA:  Onset of abdominal pain last night, severe sharp lower abdominal pain today, denies diarrhea, history diabetes mellitus, hypertension, suspected abdominal infection EXAM: CT ABDOMEN AND PELVIS WITH CONTRAST TECHNIQUE: Multidetector CT imaging of the abdomen and pelvis was performed using the standard protocol following bolus administration of intravenous contrast. Sagittal and coronal MPR images reconstructed from axial data set. CONTRAST:  182mL  ISOVUE-300 IOPAMIDOL (ISOVUE-300) INJECTION 61% IV. No oral contrast. COMPARISON:  None FINDINGS: Lower chest: Bibasilar atelectasis Hepatobiliary: Gallbladder and liver normal appearance Pancreas: Normal appearance Spleen: Normal appearance Adrenals/Urinary Tract: Adrenal glands, kidneys, ureters, and bladder normal appearance Stomach/Bowel: Appendix not visualized. Cecum lies in the central pelvis cranial to the urinary bladder. Fluid within cecum and ascending colon. Prominent mesenteric vessels and minimal edema at multiple small bowel loops. No significant bowel wall thickening. Subtle diffuse enteritis is questioned. No evidence of bowel obstruction or perforation. Vascular/Lymphatic: Few pelvic phleboliths. Aorta normal caliber. Scattered normal sized mesenteric and retroperitoneal nodes. Reproductive: Uterus and adnexa unremarkable Other: No free air or significant ascites.  No hernia. Musculoskeletal: Mild degenerative disc disease changes L4-L5. Minimal dextroconvex scoliosis. IMPRESSION: Scattered edema within small bowel mesenteries with question slight mesenteric hyperemia, raising question of a subtle diffuse enteritis. No evidence of bowel obstruction or perforation. Electronically Signed  By: Lavonia Dana M.D.   On: 06/04/2017 17:30   Dg Chest Port 1 View  Result Date: 06/04/2017 CLINICAL DATA:  Upper abdominal pain since last evening. Shortness of breath. Evaluate for free air. EXAM: PORTABLE CHEST 1 VIEW COMPARISON:  None. FINDINGS: Normal cardiac silhouette and mediastinal contours given reduced lung volumes. Minimal perihilar opacities favored to represent atelectasis. No discrete focal airspace opacities. No pleural effusion or pneumothorax. No evidence of edema. No acute osseus abnormalities. No definitive abnormal lucencies below either hemidiaphragm to suggest pneumoperitoneum. IMPRESSION: 1. No definite evidence of pneumoperitoneum. Further evaluation with dedicated abdominal  radiographic series could be performed as clinically indicated. 2. No acute cardiopulmonary disease on this hypoventilated AP portable exam. Electronically Signed   By: Sandi Mariscal M.D.   On: 06/04/2017 13:54        Scheduled Meds: . heparin  5,000 Units Subcutaneous Q8H  . zolpidem  5 mg Oral QHS   Continuous Infusions: . sodium chloride 125 mL/hr at 06/05/17 0048  . cefTRIAXone (ROCEPHIN)  IV    . metronidazole Stopped (06/05/17 0341)     LOS: 0 days    Time spent: [place], MD [facility]   If 7PM-7AM, please contact night-coverage [email or web address] Password [facility] 06/05/2017, 7:48 AM

## 2017-06-05 NOTE — Progress Notes (Signed)
Patient advanced to clear liquid diet for lunch.  Patient tolerated well.  Patient denies nausea/vomiting or pain.

## 2017-06-06 DIAGNOSIS — A419 Sepsis, unspecified organism: Secondary | ICD-10-CM | POA: Diagnosis not present

## 2017-06-06 DIAGNOSIS — N390 Urinary tract infection, site not specified: Secondary | ICD-10-CM | POA: Diagnosis not present

## 2017-06-06 LAB — CBC
HCT: 31.3 % — ABNORMAL LOW (ref 36.0–46.0)
Hemoglobin: 10.2 g/dL — ABNORMAL LOW (ref 12.0–15.0)
MCH: 27.6 pg (ref 26.0–34.0)
MCHC: 32.6 g/dL (ref 30.0–36.0)
MCV: 84.6 fL (ref 78.0–100.0)
PLATELETS: 251 10*3/uL (ref 150–400)
RBC: 3.7 MIL/uL — AB (ref 3.87–5.11)
RDW: 15.2 % (ref 11.5–15.5)
WBC: 11.7 10*3/uL — AB (ref 4.0–10.5)

## 2017-06-06 LAB — GLUCOSE, CAPILLARY
GLUCOSE-CAPILLARY: 105 mg/dL — AB (ref 65–99)
GLUCOSE-CAPILLARY: 107 mg/dL — AB (ref 65–99)
GLUCOSE-CAPILLARY: 139 mg/dL — AB (ref 65–99)

## 2017-06-06 MED ORDER — CEFUROXIME AXETIL 500 MG PO TABS: 500.0000 mg | ORAL_TABLET | Freq: Two times a day (BID) | ORAL | 0 refills | Status: DC

## 2017-06-06 NOTE — Care Management Note (Signed)
Case Management Note  Patient Details  Name: Tracy Kelley MRN: [id number] Date of Birth: Feb 04, 1971  Subjective/Objective:                  Admitted with sepsis. Pt from home, ind. Has PCP, transportation and insurance with drug coverage.   Action/Plan: DC home today with self care. No CM needs noted prior to DC.   Expected Discharge Date:  06/06/17               Expected Discharge Plan:  Home/Self Care  In-House Referral:  NA  Discharge planning Services  CM Consult  Post Acute Care Choice:  NA Choice offered to:  NA  Status of Service:  Completed, signed off  Sherald Barge, RN 06/06/2017, 2:13 PM

## 2017-06-06 NOTE — Progress Notes (Signed)
Patient discharged home with IV removed and site intact. Patient discharged with personal belongings and prescriptions.  

## 2017-06-06 NOTE — Discharge Summary (Signed)
Physician Discharge Summary  Tracy Kelley FYB:[id number] DOB: [birth year] DOA: 06/04/2017  PCP: Tracy Everts, MD  Admit date: 06/04/2017 Discharge date: 06/06/2017  Recommendations for Outpatient Follow-up:  1. Resolution of UTI  Follow-up Information    Tracy Everts, MD Follow up.   Specialty:  Family Medicine Why:  as needed Contact information: [address] [place] [address] [phone]            Discharge Diagnoses:  1. Sepsis secondary to UTI 2. Diabetes mellitus type 2  Discharge Condition: Improved Disposition: Home  Diet recommendation: Heart healthy, diabetic diet  Filed Weights   06/04/17 1254 06/04/17 1853  Weight: 83.9 kg (185 lb) 89.5 kg (197 lb 6.4 oz)    History of present illness:  46 year old woman PMH diabetes mellitus type 2, hypertension presented with lower abdominal pain, dysuria and frequency. Admitted for sepsis secondary to UTI.  Hospital Course:  Patient was treated with empiric antibiotics with gradual clinical improvement. A tentative discharge abdominal pain was resolved and she was tolerating a diet. Hospitalization was uncomplicated and comorbidities including diabetes mellitus type 2 and hypertension remained stable. Of note she had nausea or vomiting or diarrhea and had no symptoms to correlate with CT findings to suggest enteritis. She'll complete a course of oral antibiotics for UTI in the outpatient setting.  Antimicrobials:  Ceftriaxone 8/11 >> 8/12  Ceftin 8/13 >> 8/17  Metronidazole 8/12 >> 8/13  Today's assessment: S: Feels well. Eating well. No nausea or vomiting. No pain. O: Vitals: Afebrile, 100.0, 18, 119, 124/80, 97% on room air.   Constitutional. Appears calm, comfortable.  Respiratory. Clear to auscultation bilaterally. No wheezes, rales or rhonchi. Normal respiratory effort.  Cardiovascular. Regular rate and rhythm. No murmur, rub or gallop.  Abdomen soft, nontender,  nondistended.  Psychiatric. Grossly normal mood and affect. Speech fluent and appropriate.  I have personally reviewed the following:   Labs:  Capillary blood sugars stable  WBC rapidly trending downwards, 11.7. Hemoglobin stable 10.2. Platelets within normal limits.  Imaging studies:  CT on admission was scattered edema within small bowel mesenteries question subtle diffuse enteritis.  Discharge Instructions  Discharge Instructions    Activity as tolerated - No restrictions    Complete by:  As directed    Diet - low sodium heart healthy    Complete by:  As directed    Diet Carb Modified    Complete by:  As directed    Discharge instructions    Complete by:  As directed    Call your physician or seek immediate medical attention for pain, fever, vomiting or worsening of condition.     Allergies as of 06/06/2017      Reactions   Metformin And Related Nausea Only   GI intolerance nausea and diarreha      Medication List    STOP taking these medications   predniSONE 20 MG tablet Commonly known as:  DELTASONE   predniSONE 5 MG (21) Tbpk tablet Commonly known as:  STERAPRED UNI-PAK 21 TAB   sitaGLIPtin 100 MG tablet Commonly known as:  JANUVIA     TAKE these medications   acetaminophen 500 MG tablet Commonly known as:  TYLENOL Take 1,000 mg by mouth every 6 (six) hours as needed for moderate pain.   amLODipine 10 MG tablet Commonly known as:  NORVASC Take 1 tablet (10 mg total) by mouth daily.   cefUROXime 500 MG tablet Commonly known as:  CEFTIN Take 1 tablet (  500 mg total) by mouth 2 (two) times daily with a meal.   glipiZIDE 5 MG tablet Commonly known as:  GLUCOTROL Take 1 tablet (5 mg total) by mouth daily.   hydrochlorothiazide 25 MG tablet Commonly known as:  HYDRODIURIL Take 1 tablet (25 mg total) by mouth daily.   multivitamin with minerals Tabs tablet Take 1 tablet by mouth daily.   pravastatin 20 MG tablet Commonly known as:   PRAVACHOL Take 1 tablet (20 mg total) by mouth daily.   temazepam 15 MG capsule Commonly known as:  RESTORIL TAKE 1 TO 2 CAPSULES AT BEDTIME AS NEEDED FOR SLEEP      Allergies  Allergen Reactions  . Metformin And Related Nausea Only    GI intolerance nausea and diarreha    The results of significant diagnostics from this hospitalization (including imaging, microbiology, ancillary and laboratory) are listed below for reference.    Significant Diagnostic Studies: Ct Abdomen Pelvis W Contrast  Result Date: 06/04/2017 CLINICAL DATA:  Onset of abdominal pain last night, severe sharp lower abdominal pain today, denies diarrhea, history diabetes mellitus, hypertension, suspected abdominal infection EXAM: CT ABDOMEN AND PELVIS WITH CONTRAST TECHNIQUE: Multidetector CT imaging of the abdomen and pelvis was performed using the standard protocol following bolus administration of intravenous contrast. Sagittal and coronal MPR images reconstructed from axial data set. CONTRAST:  180mL ISOVUE-300 IOPAMIDOL (ISOVUE-300) INJECTION 61% IV. No oral contrast. COMPARISON:  None FINDINGS: Lower chest: Bibasilar atelectasis Hepatobiliary: Gallbladder and liver normal appearance Pancreas: Normal appearance Spleen: Normal appearance Adrenals/Urinary Tract: Adrenal glands, kidneys, ureters, and bladder normal appearance Stomach/Bowel: Appendix not visualized. Cecum lies in the central pelvis cranial to the urinary bladder. Fluid within cecum and ascending colon. Prominent mesenteric vessels and minimal edema at multiple small bowel loops. No significant bowel wall thickening. Subtle diffuse enteritis is questioned. No evidence of bowel obstruction or perforation. Vascular/Lymphatic: Few pelvic phleboliths. Aorta normal caliber. Scattered normal sized mesenteric and retroperitoneal nodes. Reproductive: Uterus and adnexa unremarkable Other: No free air or significant ascites.  No hernia. Musculoskeletal: Mild degenerative  disc disease changes L4-L5. Minimal dextroconvex scoliosis. IMPRESSION: Scattered edema within small bowel mesenteries with question slight mesenteric hyperemia, raising question of a subtle diffuse enteritis. No evidence of bowel obstruction or perforation. Electronically Signed   By: Lavonia Dana M.D.   On: 06/04/2017 17:30   Dg Chest Port 1 View  Result Date: 06/04/2017 CLINICAL DATA:  Upper abdominal pain since last evening. Shortness of breath. Evaluate for free air. EXAM: PORTABLE CHEST 1 VIEW COMPARISON:  None. FINDINGS: Normal cardiac silhouette and mediastinal contours given reduced lung volumes. Minimal perihilar opacities favored to represent atelectasis. No discrete focal airspace opacities. No pleural effusion or pneumothorax. No evidence of edema. No acute osseus abnormalities. No definitive abnormal lucencies below either hemidiaphragm to suggest pneumoperitoneum. IMPRESSION: 1. No definite evidence of pneumoperitoneum. Further evaluation with dedicated abdominal radiographic series could be performed as clinically indicated. 2. No acute cardiopulmonary disease on this hypoventilated AP portable exam. Electronically Signed   By: Sandi Mariscal M.D.   On: 06/04/2017 13:54   Labs: Basic Metabolic Panel:  Recent Labs Lab 06/04/17 1333 06/05/17 0244  NA 135 139  K 3.1* 3.1*  CL 98* 105  CO2 25 26  GLUCOSE 162* 96  BUN 11 9  CREATININE 0.68 0.58  CALCIUM 9.1 8.1*   Liver Function Tests:  Recent Labs Lab 06/04/17 1333 06/05/17 0244  AST 25 11*  ALT 36 25  ALKPHOS 54 45  BILITOT 1.2 1.1  PROT 7.2 6.4*  ALBUMIN 3.8 3.1*   CBC:  Recent Labs Lab 06/04/17 1333 06/05/17 0244 06/06/17 0956  WBC 22.4* 18.1* 11.7*  NEUTROABS 19.8*  --   --   HGB 11.8* 10.4* 10.2*  HCT 35.5* 31.5* 31.3*  MCV 83.5 84.2 84.6  PLT 277 216 251    CBG:  Recent Labs Lab 06/05/17 2022 06/05/17 2334 06/06/17 0344 06/06/17 0731 06/06/17 1125  GLUCAP 109* 85 105* 107* 139*    Active  Problems:   Sepsis ([place])   UTI (urinary tract infection)   Enteritis   Leukocytosis   Time coordinating discharge: 35 minutes  Signed:  Murray Hodgkins, MD [facility] 06/06/2017, 6:37 PM

## 2017-06-07 LAB — HIV ANTIBODY (ROUTINE TESTING W REFLEX): HIV SCREEN 4TH GENERATION: NONREACTIVE

## 2017-06-14 ENCOUNTER — Other Ambulatory Visit: Payer: Self-pay | Admitting: Family Medicine

## 2017-06-14 NOTE — Telephone Encounter (Signed)
Seen 7 12 18 

## 2017-06-17 ENCOUNTER — Telehealth: Payer: Self-pay | Admitting: *Deleted

## 2017-06-17 NOTE — Telephone Encounter (Signed)
Patient called requesting her temazepam to be resent, per patient [facility] pharmacy told her they spoke with someone today and they would re send it if they found the original, per patient they still do not have it. I made patient aware I would let the nurse know

## 2017-06-17 NOTE — Telephone Encounter (Signed)
Tammy from [facility] states she received a denial on patient's temazepam 06/14/17 with a note that stated 'new script to follow' and she never received said new script. I can not find where one was faxed. Ok to send?

## 2017-06-17 NOTE — Telephone Encounter (Signed)
Message pending for PCP

## 2017-06-20 MED ORDER — TEMAZEPAM 15 MG PO CAPS: ORAL_CAPSULE | ORAL | 2 refills | Status: DC

## 2017-06-20 NOTE — Telephone Encounter (Signed)
yes

## 2017-08-05 ENCOUNTER — Encounter: Payer: PRIVATE HEALTH INSURANCE | Admitting: Family Medicine

## 2017-08-16 ENCOUNTER — Other Ambulatory Visit: Payer: Self-pay

## 2017-09-02 ENCOUNTER — Encounter: Payer: PRIVATE HEALTH INSURANCE | Admitting: Family Medicine

## 2017-09-05 ENCOUNTER — Other Ambulatory Visit: Payer: Self-pay

## 2017-09-05 ENCOUNTER — Ambulatory Visit (INDEPENDENT_AMBULATORY_CARE_PROVIDER_SITE_OTHER): Payer: PRIVATE HEALTH INSURANCE | Admitting: Family Medicine

## 2017-09-05 ENCOUNTER — Encounter: Payer: Self-pay | Admitting: Family Medicine

## 2017-09-05 VITALS — BP 118/84 | HR 92 | Temp 98.2°F | Resp 18 | Ht 69.0 in | Wt 195.1 lb

## 2017-09-05 DIAGNOSIS — E119 Type 2 diabetes mellitus without complications: Secondary | ICD-10-CM | POA: Diagnosis not present

## 2017-09-05 DIAGNOSIS — E040 Nontoxic diffuse goiter: Secondary | ICD-10-CM

## 2017-09-05 DIAGNOSIS — E559 Vitamin D deficiency, unspecified: Secondary | ICD-10-CM

## 2017-09-05 DIAGNOSIS — E785 Hyperlipidemia, unspecified: Secondary | ICD-10-CM

## 2017-09-05 DIAGNOSIS — I10 Essential (primary) hypertension: Secondary | ICD-10-CM | POA: Diagnosis not present

## 2017-09-05 DIAGNOSIS — Z0000 Encounter for general adult medical examination without abnormal findings: Secondary | ICD-10-CM

## 2017-09-05 DIAGNOSIS — E049 Nontoxic goiter, unspecified: Secondary | ICD-10-CM | POA: Diagnosis not present

## 2017-09-05 DIAGNOSIS — Z23 Encounter for immunization: Secondary | ICD-10-CM | POA: Diagnosis not present

## 2017-09-05 MED ORDER — HYDROCHLOROTHIAZIDE 25 MG PO TABS: 25.0000 mg | ORAL_TABLET | Freq: Every day | ORAL | 3 refills | Status: DC

## 2017-09-05 MED ORDER — GLIPIZIDE 5 MG PO TABS: 5.0000 mg | ORAL_TABLET | Freq: Every day | ORAL | 3 refills | Status: DC

## 2017-09-05 MED ORDER — AMLODIPINE BESYLATE 10 MG PO TABS: 10.0000 mg | ORAL_TABLET | Freq: Every day | ORAL | 3 refills | Status: DC

## 2017-09-05 MED ORDER — PRAVASTATIN SODIUM 20 MG PO TABS: 20.0000 mg | ORAL_TABLET | Freq: Every day | ORAL | 3 refills | Status: DC

## 2017-09-05 NOTE — Progress Notes (Signed)
Chief Complaint  Patient presents with  . Annual Exam  Patient presents today for her annual examination. She does not recall her last Pap smear.  She thinks it was "2 or 3 years ago".  We will request those records.  She is offered a Pap today, but declines because of menses.  All of her Paps have been normal. Mammogram is 2018. Immunizations are up-to-date with flu shot and tetanus.  Is never had a pneumonia shot.  Is given a Prevnar today. Lifestyle, diet, and exercises reviewed. Blood pressure is well controlled Hyperlipidemia, on statin, is controlled Patient has a goiter.  Occasionally symptomatic.  Ultrasound is stable.  Thyroid function tests have been normal.   Patient Active Problem List   Diagnosis Date Noted  . Sepsis ([place]) 06/04/2017  . UTI (urinary tract infection) 06/04/2017  . Enteritis 06/04/2017  . Leukocytosis 06/04/2017  . Goiter diffuse 05/05/2017  . Diabetes mellitus type 2 in nonobese ([place]) 11/05/2016  . Essential hypertension 11/05/2016  . Hyperlipemia 11/05/2016  . Insomnia secondary to anxiety 11/05/2016    Outpatient Encounter Medications as of 09/05/2017  Medication Sig  . acetaminophen (TYLENOL) 500 MG tablet Take 1,000 mg by mouth every 6 (six) hours as needed for moderate pain.  Marland Kitchen amLODipine (NORVASC) 10 MG tablet Take 1 tablet (10 mg total) daily by mouth.  Marland Kitchen glipiZIDE (GLUCOTROL) 5 MG tablet Take 1 tablet (5 mg total) daily by mouth.  . hydrochlorothiazide (HYDRODIURIL) 25 MG tablet Take 1 tablet (25 mg total) daily by mouth.  . Multiple Vitamin (MULTIVITAMIN WITH MINERALS) TABS tablet Take 1 tablet by mouth daily.  . pravastatin (PRAVACHOL) 20 MG tablet Take 1 tablet (20 mg total) daily by mouth.  . temazepam (RESTORIL) 15 MG capsule TAKE ONE OR TWO CAPSULES AT BEDTIME AS NEEDED FOR SLEEP.   No facility-administered encounter medications on file as of 09/05/2017.     Allergies  Allergen Reactions  . Metformin And Related Nausea Only   GI intolerance nausea and diarreha    Review of Systems  Constitutional: Negative for activity change, appetite change and unexpected weight change.  HENT: Negative for congestion, dental problem, postnasal drip and rhinorrhea.   Eyes: Negative for redness and visual disturbance.  Respiratory: Negative for cough and shortness of breath.   Cardiovascular: Negative for chest pain, palpitations and leg swelling.  Gastrointestinal: Negative for abdominal pain, constipation and diarrhea.  Genitourinary: Negative for difficulty urinating, frequency and menstrual problem.  Musculoskeletal: Negative for arthralgias and back pain.  Neurological: Negative for dizziness and headaches.  Psychiatric/Behavioral: Negative for dysphoric mood and sleep disturbance. The patient is not nervous/anxious.     BP 118/84 (BP Location: Right Arm, Patient Position: Sitting, Cuff Size: Normal)   Pulse 92   Temp 98.2 F (36.8 C) (Temporal)   Resp 18   Ht 5\' 9"  (1.753 m)   Wt 195 lb 1.3 oz (88.5 kg)   LMP 09/05/2017 (Exact Date)   SpO2 100%   BMI 28.81 kg/m   Physical Exam  Constitutional: She is oriented to person, place, and time. She appears well-developed and well-nourished.  HENT:  Head: Normocephalic and atraumatic.  Right Ear: External ear normal.  Left Ear: External ear normal.  Mouth/Throat: Oropharynx is clear and moist.  Eyes: Conjunctivae are normal. Pupils are equal, round, and reactive to light.  Discs flat  Neck: Normal range of motion. Neck supple. Thyromegaly present.  Cardiovascular: Normal rate, regular rhythm and normal heart sounds.  Pulses:  Dorsalis pedis pulses are 2+ on the right side, and 2+ on the left side.       Posterior tibial pulses are 2+ on the right side, and 2+ on the left side.  Pulmonary/Chest: Effort normal and breath sounds normal. No respiratory distress. She has no wheezes. Right breast exhibits no inverted nipple and no mass. Left breast exhibits no  inverted nipple and no mass. Breasts are symmetrical.  Abdominal: Soft. Bowel sounds are normal.  No HSM  Genitourinary:  Genitourinary Comments: defer  Musculoskeletal: Normal range of motion. She exhibits no edema.       Right foot: There is normal range of motion and no deformity.       Left foot: There is normal range of motion and no deformity.  Feet:  Right Foot:  Protective Sensation: 4 sites tested. 4 sites sensed.  Skin Integrity: Negative for ulcer, skin breakdown or dry skin.  Left Foot:  Protective Sensation: 4 sites tested. 4 sites sensed.  Skin Integrity: Negative for ulcer, skin breakdown or dry skin.  Lymphadenopathy:    She has no cervical adenopathy.    She has no axillary adenopathy.       Right: No inguinal adenopathy present.       Left: No inguinal adenopathy present.  Neurological: She is alert and oriented to person, place, and time.  Gait normal  Skin: Skin is warm and dry.  Psychiatric: She has a normal mood and affect. Her behavior is normal. Thought content normal.  Nursing note and vitals reviewed.   ASSESSMENT/PLAN:  1. Essential hypertension controlled  2. Diabetes mellitus type 2 in nonobese (HCC) Controlled.  Due for an A1c in January - CBC - COMPLETE METABOLIC PANEL WITH GFR - Hemoglobin A1c - Microalbumin / creatinine urine ratio - Urinalysis, Routine w reflex microscopic  3. Goiter diffuse Occasionally causes soreness in the neck.  Today asymptomatic - TSH  4. Hyperlipidemia, unspecified hyperlipidemia type Controlled - Lipid panel  5. Vitamin D deficiency On replacement - VITAMIN D 25 Hydroxy (Vit-D Deficiency, Fractures)  6. PE (physical exam), annual No unexpected findings.  Pap smear deferred  7. Need for pneumococcal vaccination Done - Pneumococcal conjugate vaccine 13-valent IM   Patient Instructions  You are doing well Try to exercise every day that you are able Come back for a PAP and labs in Jan or Feb My  staff will request old records again   [name], MD

## 2017-09-05 NOTE — Patient Instructions (Signed)
You are doing well Try to exercise every day that you are able Come back for a PAP and labs in Jan or Feb My staff will request old records again

## 2017-09-06 ENCOUNTER — Encounter: Payer: PRIVATE HEALTH INSURANCE | Admitting: Family Medicine

## 2017-09-12 ENCOUNTER — Ambulatory Visit (INDEPENDENT_AMBULATORY_CARE_PROVIDER_SITE_OTHER): Payer: PRIVATE HEALTH INSURANCE | Admitting: Family Medicine

## 2017-09-12 ENCOUNTER — Other Ambulatory Visit: Payer: Self-pay

## 2017-09-12 ENCOUNTER — Other Ambulatory Visit (HOSPITAL_COMMUNITY)
Admission: RE | Admit: 2017-09-12 | Discharge: 2017-09-12 | Disposition: A | Discharge status: Home / Self Care | Payer: PRIVATE HEALTH INSURANCE | Source: Ambulatory Visit | Attending: Family Medicine | Admitting: Family Medicine

## 2017-09-12 VITALS — BP 126/88 | HR 100 | Temp 98.7°F | Resp 16 | Ht 69.0 in | Wt 196.0 lb

## 2017-09-12 DIAGNOSIS — Z124 Encounter for screening for malignant neoplasm of cervix: Secondary | ICD-10-CM

## 2017-09-12 NOTE — Patient Instructions (Signed)
I will send you a letter with your test results.  If there is anything of concern, we will call right away. See me every 6 months

## 2017-09-12 NOTE — Progress Notes (Signed)
BP 126/88 (BP Location: Right Arm, Patient Position: Sitting, Cuff Size: Large)   Pulse 100   Temp 98.7 F (37.1 C) (Temporal)   Resp 16   Ht 5\' 9"  (1.753 m)   Wt 196 lb 0.6 oz (88.9 kg)   LMP 09/05/2017 (Exact Date)   SpO2 100%   BMI 28.95 kg/m  Patient is here for Pap only.  She had a physical examination last week but declined Pap smear because she was having her menstrual period.  She is here today for cervical cancer screening.  No dysuria or difficulty urination.  No vaginal discharge or infection.  No problems to report.  Last Pap was negative.  Patient is placed in the dorsolithotomy position.  External genitalia is normal.  Cervical exam performed, negative.  Pap is obtained.  Scant bleeding with specimen.  Bimanual exam is performed.  Uterus is normal anteflexed.  Adnexa are clear  Encounter for screening for cervical cancer  - Plan: Cytology - PAP  Patient Instructions  I will send you a letter with your test results.  If there is anything of concern, we will call right away. See me every 6 months

## 2017-09-14 ENCOUNTER — Encounter: Payer: Self-pay | Admitting: Family Medicine

## 2017-09-14 LAB — CYTOLOGY - PAP
Diagnosis: NEGATIVE
HPV (WINDOPATH): NOT DETECTED

## 2017-09-23 ENCOUNTER — Other Ambulatory Visit: Payer: Self-pay | Admitting: Family Medicine

## 2017-09-23 NOTE — Telephone Encounter (Signed)
Seen 11 19 18 

## 2017-09-26 ENCOUNTER — Other Ambulatory Visit: Payer: Self-pay

## 2017-09-26 NOTE — Telephone Encounter (Signed)
Seen 11 19 18 

## 2017-09-27 MED ORDER — TEMAZEPAM 15 MG PO CAPS: ORAL_CAPSULE | ORAL | 0 refills | Status: DC

## 2017-09-27 NOTE — Telephone Encounter (Signed)
Summary   Summary  Total Prescriptions: 14  Total Prescribers: 4  Total Pharmacies: 2  Narcotics* (excluding buprenorphine) Current Qty: 0  Current MME/day: 0.00  30 Day Avg MME/day: 0.00  Sedatives*  Current Qty: 0  Current LME/day: 0.00  30 Day Avg LME/day: 0.35  Buprenorphine*  Current Qty: 0  Current mg/day: 0.00  30 Day Avg mg/day: 0.00  Rx Data   Total Prescriptions: 14    PRESCRIPTIONS Total Prescriptions: 14  Total Private Pay: 0  Fill Date ID Written Drug Qty Days Prescriber Rx # Pharmacy Refill Daily Dose * Pymt Type PMP  08/12/2017  1   07/21/2017  Temazepam 15 MG Capsule  30 30 Yv Nel  [id number] [facility] ([id number])  0 0.75 LME  Comm Ins  [place]  07/13/2017  1   06/20/2017  Temazepam 15 MG Capsule  30 30 Yv Nel  [id number] [facility] ([id number])  1 0.75 LME  Comm Ins  [place]  06/20/2017  1   06/20/2017  Temazepam 15 MG Capsule  30 30 Yv Nel  [id number] [facility] ([id number])  0 0.75 LME  Comm Ins  [place]  05/14/2017  1   03/09/2017  Temazepam 15 MG Capsule  30 30 Yv Nel  [id number] Re[facility] ([id number])  2 0.75 LME  Comm Ins  [place]  04/16/2017  1   03/09/2017  Temazepam 15 MG Capsule  30 15 Yv Nel  [id number] [facility] ([id number])  1 1.50 LME  Comm Ins  [place]  03/09/2017  1   03/09/2017  Temazepam 15 MG Capsule  30 30 Yv Nel  [id number] [facility] ([id number])  0 0.75 LME  Comm Ins  [place]  02/14/2017  1   02/14/2017  Temazepam 15 MG Capsule  30 15 Yv Nel  [id number] [facility] ([id number])  0 1.50 LME  Comm Ins  [place]  12/15/2016  1   12/15/2016  Temazepam 15 MG Capsule  30 15 Ma Sim  [id number] Re[facility] ([id number])  0 1.50 LME  Comm Ins  [place]  11/06/2016  1   11/06/2016  Temazepam 15 MG Capsule  30 15 Yv Nel  [id number] [facility] ([id number])  0 1.50 LME  Comm Ins  [place]  09/08/2016  1   09/08/2016  Temazepam 15 MG Capsule  30 15 Sh Ran  [id number] Rei ([id number])  0 1.50 LME  Comm Ins  [place]  08/17/2016  1   06/17/2016  Temazepam 15 MG Capsule  30 15 Sa J  [id number] Rei ([id number])  3 1.50 LME  Comm Ins  [place]  08/02/2016  1   06/17/2016  Temazepam 15 MG Capsule  30 15 Sa J  [id number] Rei ([id number])  2 1.50 LME  Comm Ins  [place]  07/15/2016  2    06/17/2016  Temazepam 15 MG Capsule  30 15 Sa J  [id number] Rei ([id number])  1 1.50 LME  Comm Ins  [place]  06/17/2016  2   06/17/2016  Temazepam 15 MG Capsule  30 15 Sa J  [id number] Rei ([id number])  0 1.50 LME  Comm Ins  [place]  *Per CDC guidance, the MME conversion factors prescribed or provided as part of the medication-assisted treatment for opioid use disorder should not be used to benchmark against dosage thresholds meant for opioids prescribed for pain. Buprenorphine products have no agreed upon morphine equivalency, and as partial opioid agonists, are not expected to be associated with overdose risk in the same dose-dependent manner as doses for full agonist opioids. MME = morphine milligram equivalents. LME =  Lorazepam milligram equivalents. MG = dose in milligrams. PROVIDERS Total Providers: 4  Name [place] [address] [place] [address]   Margaret Elizabeth Bonanza [place] [address]   Shuvon B. Rankin Family Nurse Practitioner ([facility]) [place] [address]   Patsy [place] [address] [place] [address]   PHARMACIES Total Pharmacies: 2  Name [place] Phone  [facility] [phone] [address] [place] [address] [phone]  [facility] [phone]) [address] [place] [address] [phone]  Mat Providers INFORMATIONAL DOCUMENTS Drug Details  Fill Date Drug Qty Days Prescriber Pharm Refill MgEq MgEq/Day  Close  Prescriber Details  Name [place]  Close    Hold and drag your mouse around an area on the graph to see details about the prescriptions in that time peroid.

## 2017-09-29 ENCOUNTER — Other Ambulatory Visit: Payer: Self-pay | Admitting: Family Medicine

## 2017-09-29 MED ORDER — TEMAZEPAM 15 MG PO CAPS: ORAL_CAPSULE | ORAL | 0 refills | Status: DC

## 2017-09-29 NOTE — Telephone Encounter (Signed)
Patient is requesting all medications to go to [facility] not [facility] because of insurance changes. Please send her last medication [facility] to [facility]

## 2017-09-30 ENCOUNTER — Other Ambulatory Visit: Payer: Self-pay | Admitting: Family Medicine

## 2017-09-30 MED ORDER — TEMAZEPAM 15 MG PO CAPS: ORAL_CAPSULE | ORAL | 0 refills | Status: DC

## 2017-12-21 ENCOUNTER — Telehealth: Payer: Self-pay

## 2017-12-21 NOTE — Telephone Encounter (Signed)
Requesting refill for Amlodipine 10 mg per pt.  Please call to [facility] in [place].  Call pt with questions.

## 2017-12-22 MED ORDER — AMLODIPINE BESYLATE 10 MG PO TABS: 10.0000 mg | ORAL_TABLET | Freq: Every day | ORAL | 0 refills | Status: DC

## 2017-12-22 NOTE — Telephone Encounter (Signed)
Seen 11 19 18 

## 2018-01-02 ENCOUNTER — Encounter: Payer: Self-pay | Admitting: Family Medicine

## 2018-05-02 IMAGING — US US THYROID
1 series · 13 of 25 positions shown · non-contrast
Comparison: 05/16/2014

CLINICAL DATA: Goiter.

EXAM:
THYROID ULTRASOUND
TECHNIQUE: Ultrasound examination of the thyroid gland and adjacent soft
tissues was performed.

[Series 1: us thyroid · 0.06mm/px · 13 of 65 slices shown]
[im 1/65]
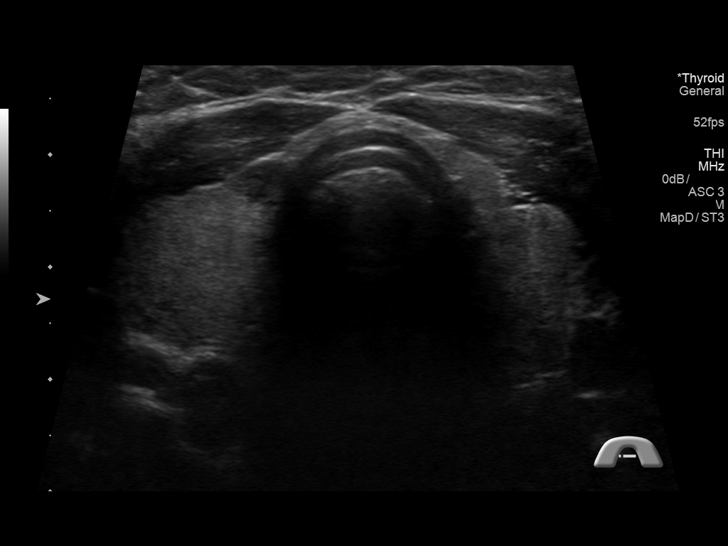
[im 6/65]
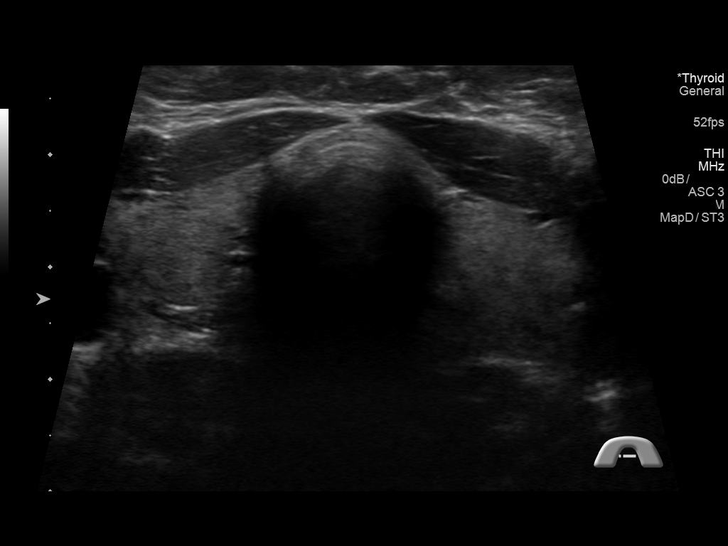
[im 11/65]
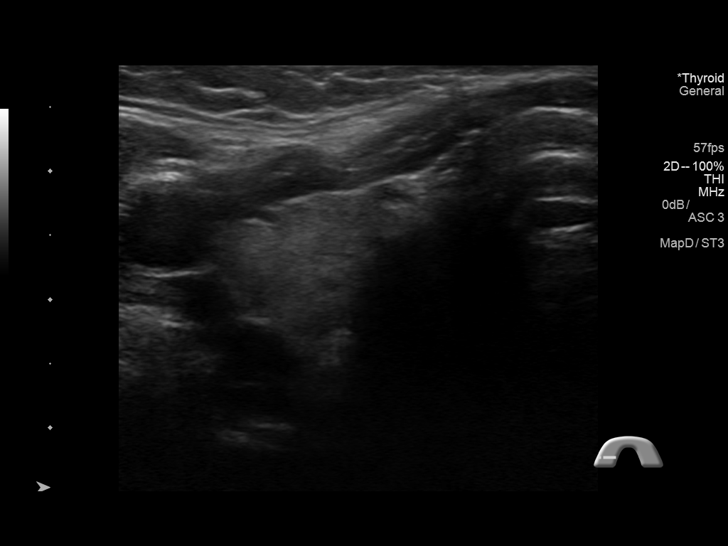
[im 17/65]
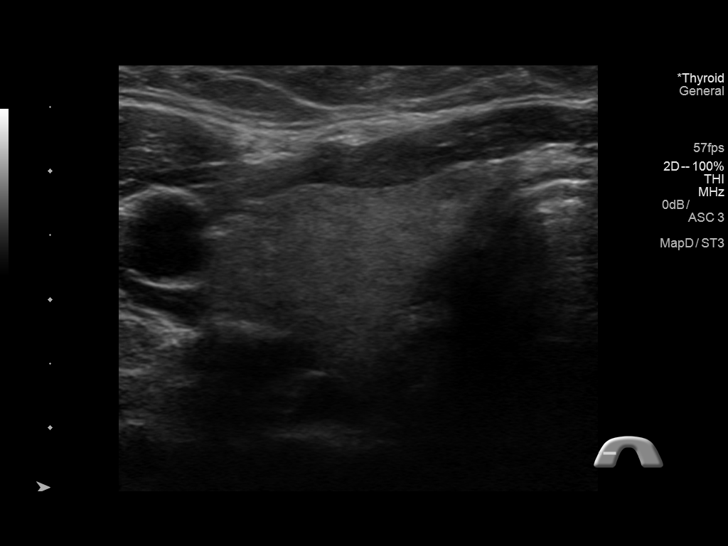
[im 22/65]
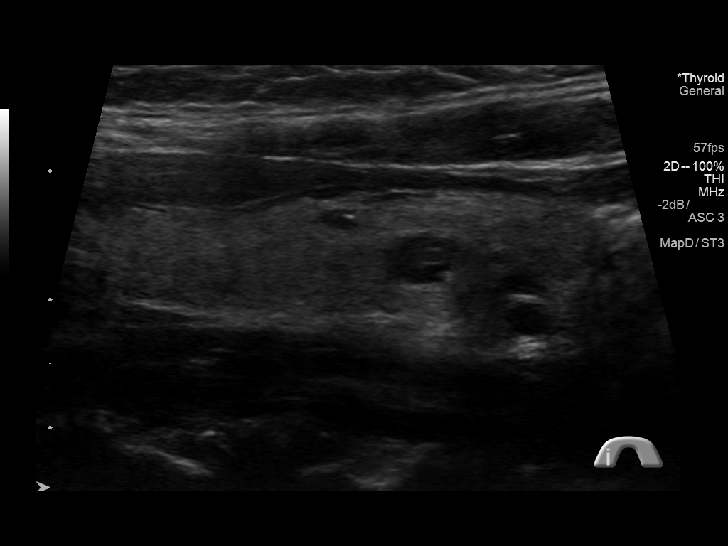
[im 27/65]
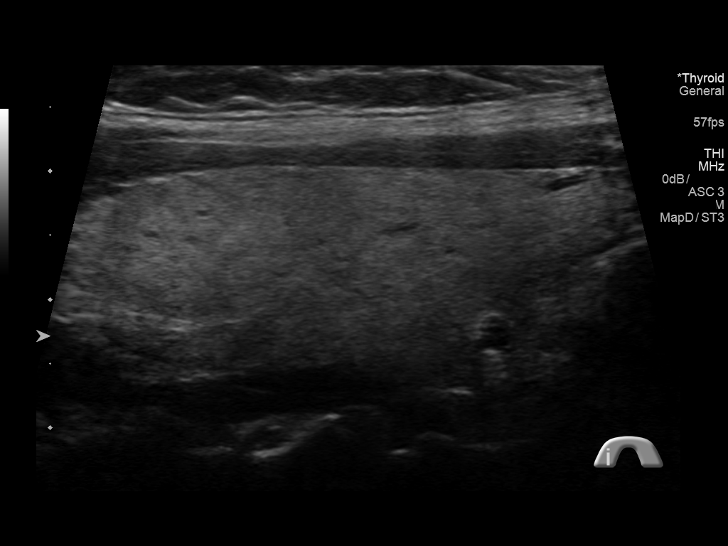
[im 33/65]
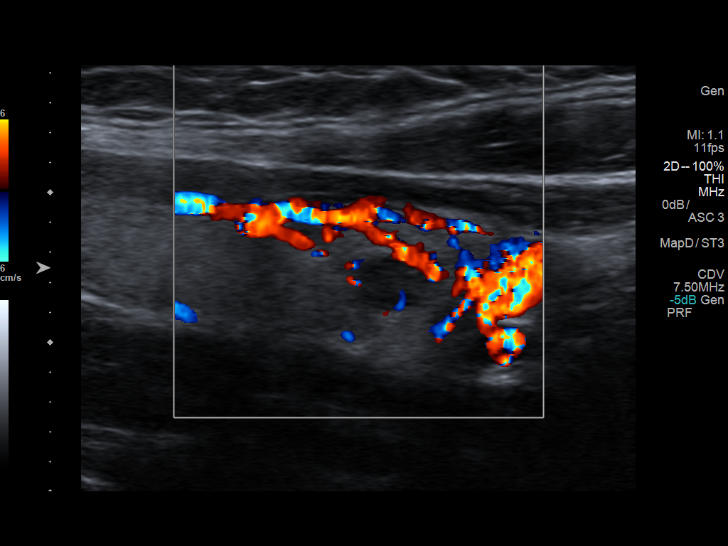
[im 38/65]
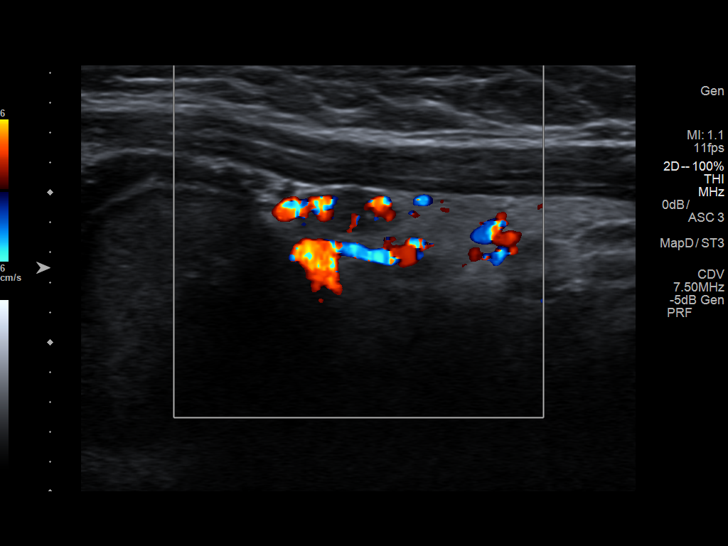
[im 43/65]
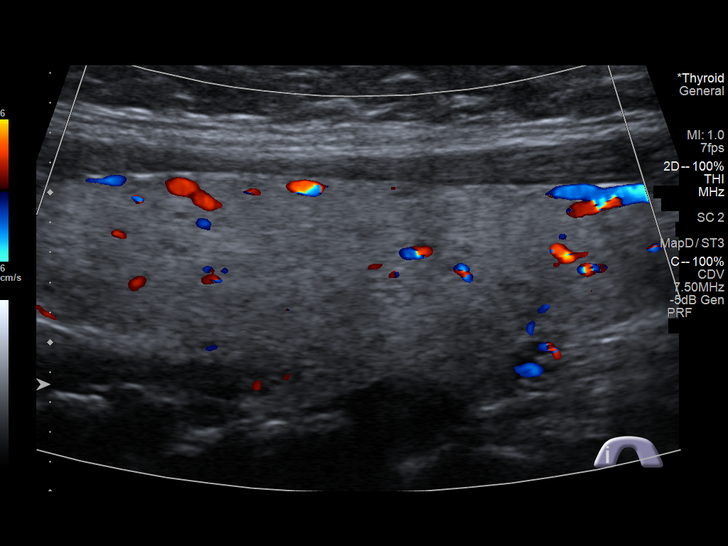
[im 49/65]
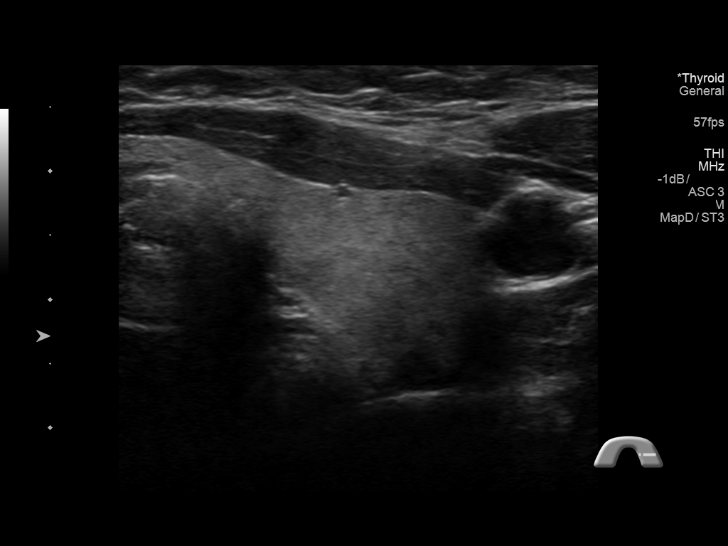
[im 54/65]
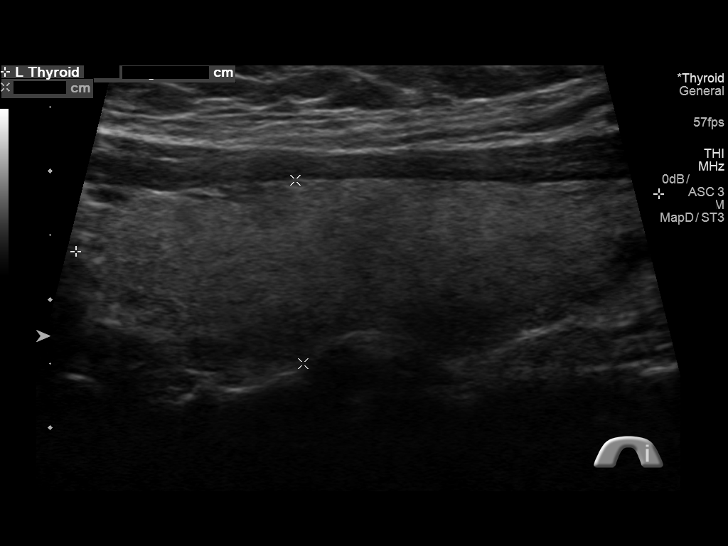
[im 59/65]
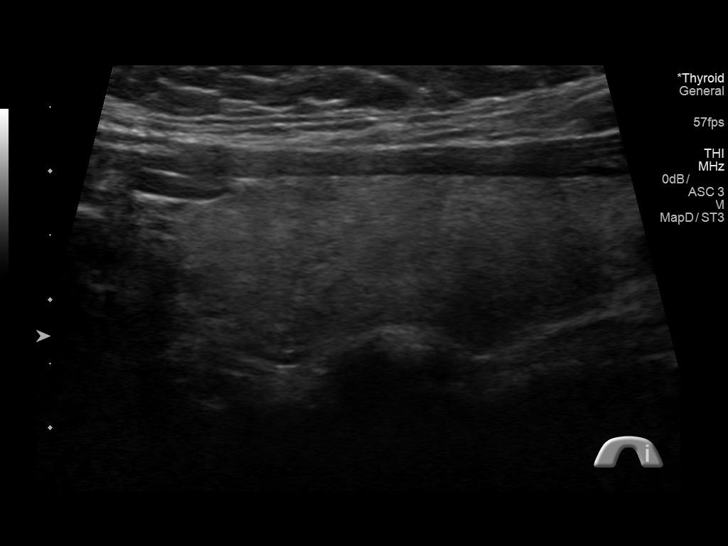
[im 65/65]
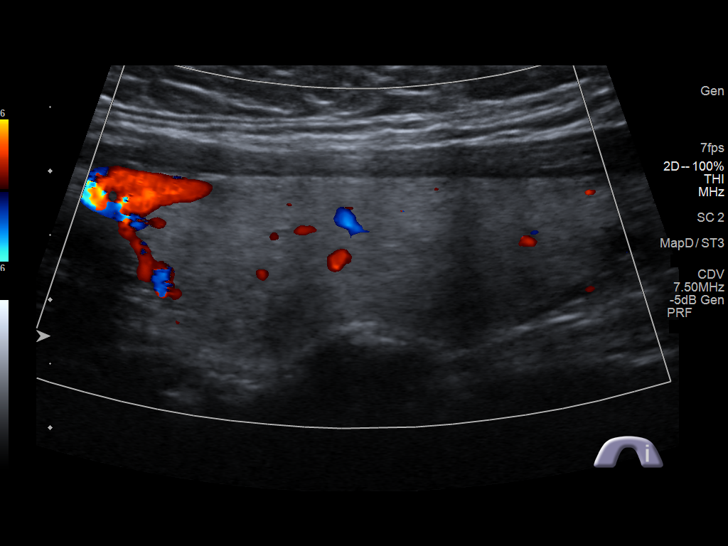

[13 of 25 positions shown; findings below may reference images not displayed]

FINDINGS: Parenchymal Echotexture: Mildly heterogenous

Isthmus: Normal in size measuring in diameter, unchanged

Right lobe: Normal in size measuring 4.9 x 1.5 x 1.8 cm, unchanged,
previously, 4.9 x 1.5 x 2.1 cm

Left lobe: Normal in size measuring 4.6 x 1.4 x 1.8 cm, unchanged,
previously, 4.2 x 1.5 x 1.8 cm

_________________________________________________________

Estimated total number of nodules >/= 1 cm: 0

Number of spongiform nodules >/=  2 cm not described below (TR1): 0

Number of mixed cystic and solid nodules >/= 1.5 cm not described
below (TR2): 0

_________________________________________________________

There are 2 punctate (sub 0.6 cm) hypoechoic, likely cystic nodules
within the right lobe of the thyroid, both of which are unchanged
compared to the [DATE] examination. Neither nodule meets imaging
criteria to recommend percutaneous sampling or dedicated follow-up.
IMPRESSION: 1. No new or enlarging thyroid nodules.
2. Punctate (approximately 0.6 cm) hypoechoic likely cystic nodules
within the right lobe of the thyroid are unchanged compared to the
[DATE] examination. Neither nodule meets imaging criteria to
recommend percutaneous sampling or dedicated follow-up.

The above is in keeping with the ACR TI-RADS recommendations - [HOSPITAL] 3180;[DATE].

## 2018-07-26 ENCOUNTER — Other Ambulatory Visit: Payer: Self-pay | Admitting: Family Medicine

## 2018-07-26 DIAGNOSIS — Z1231 Encounter for screening mammogram for malignant neoplasm of breast: Secondary | ICD-10-CM

## 2018-08-01 ENCOUNTER — Other Ambulatory Visit: Payer: Self-pay | Admitting: Family Medicine

## 2018-08-01 NOTE — Telephone Encounter (Signed)
Pt had physical scheduled with provider on 11/4

## 2018-08-02 ENCOUNTER — Telehealth: Payer: Self-pay | Admitting: Family Medicine

## 2018-08-02 NOTE — Telephone Encounter (Signed)
Patient stated she thought she would be ok on her meds until her appt. Patient stated she would release her records to [place] today and have her mammo she is having this week sent to [place] for review. If she needs anything before her appt she will contact the office.

## 2018-08-02 NOTE — Telephone Encounter (Signed)
See telephone message

## 2018-08-02 NOTE — Telephone Encounter (Signed)
Received request for prescription refill, I have not seen this patient before, she is getting established here in November. Please call patient and verify that she is taking this medication and that she is coming to be seen with me at that scheduled appointment. If all this is true then she may have a 30 day rx sent in to cover her until her appt.

## 2018-08-03 ENCOUNTER — Ambulatory Visit (HOSPITAL_COMMUNITY)
Admission: RE | Admit: 2018-08-03 | Discharge: 2018-08-03 | Disposition: A | Discharge status: Home / Self Care | Payer: PRIVATE HEALTH INSURANCE | Source: Ambulatory Visit | Attending: Family Medicine | Admitting: Family Medicine

## 2018-08-03 DIAGNOSIS — Z1231 Encounter for screening mammogram for malignant neoplasm of breast: Secondary | ICD-10-CM | POA: Insufficient documentation

## 2018-08-07 ENCOUNTER — Encounter (HOSPITAL_COMMUNITY): Payer: Self-pay

## 2018-08-23 ENCOUNTER — Ambulatory Visit: Payer: Self-pay | Admitting: Family Medicine

## 2018-08-28 ENCOUNTER — Encounter: Payer: Self-pay | Admitting: Family Medicine

## 2018-09-11 ENCOUNTER — Encounter: Payer: Self-pay | Admitting: Family Medicine

## 2018-09-12 ENCOUNTER — Ambulatory Visit (INDEPENDENT_AMBULATORY_CARE_PROVIDER_SITE_OTHER): Payer: PRIVATE HEALTH INSURANCE | Admitting: Family Medicine

## 2018-09-12 ENCOUNTER — Encounter

## 2018-09-12 ENCOUNTER — Encounter: Payer: Self-pay | Admitting: Family Medicine

## 2018-09-12 VITALS — BP 150/90 | Ht 69.0 in | Wt 205.0 lb

## 2018-09-12 DIAGNOSIS — Z0000 Encounter for general adult medical examination without abnormal findings: Secondary | ICD-10-CM

## 2018-09-12 DIAGNOSIS — Z79899 Other long term (current) drug therapy: Secondary | ICD-10-CM

## 2018-09-12 DIAGNOSIS — E119 Type 2 diabetes mellitus without complications: Secondary | ICD-10-CM

## 2018-09-12 DIAGNOSIS — Z23 Encounter for immunization: Secondary | ICD-10-CM | POA: Diagnosis not present

## 2018-09-12 DIAGNOSIS — E785 Hyperlipidemia, unspecified: Secondary | ICD-10-CM

## 2018-09-12 DIAGNOSIS — R5383 Other fatigue: Secondary | ICD-10-CM

## 2018-09-12 DIAGNOSIS — I10 Essential (primary) hypertension: Secondary | ICD-10-CM | POA: Diagnosis not present

## 2018-09-12 LAB — POCT GLYCOSYLATED HEMOGLOBIN (HGB A1C): Hemoglobin A1C: 5 % (ref 4.0–5.6)

## 2018-09-12 MED ORDER — AMLODIPINE BESYLATE 10 MG PO TABS: 10.0000 mg | ORAL_TABLET | Freq: Every day | ORAL | 1 refills | Status: DC

## 2018-09-12 MED ORDER — PRAVASTATIN SODIUM 20 MG PO TABS: 20.0000 mg | ORAL_TABLET | Freq: Every day | ORAL | 1 refills | Status: DC

## 2018-09-12 MED ORDER — HYDROCHLOROTHIAZIDE 25 MG PO TABS: 25.0000 mg | ORAL_TABLET | Freq: Every day | ORAL | 1 refills | Status: DC

## 2018-09-12 MED ORDER — GLIPIZIDE 5 MG PO TABS: 2.5000 mg | ORAL_TABLET | Freq: Every day | ORAL | 0 refills | Status: DC
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
## 2018-10-14 LAB — TSH: TSH: 1.05 u[IU]/mL (ref 0.450–4.500)

## 2018-10-14 LAB — BASIC METABOLIC PANEL
BUN/Creatinine Ratio: 14 (ref 9–23)
BUN: 11 mg/dL (ref 6–24)
CO2: 24 mmol/L (ref 20–29)
Calcium: 9.8 mg/dL (ref 8.7–10.2)
Chloride: 100 mmol/L (ref 96–106)
Creatinine, Ser: 0.76 mg/dL (ref 0.57–1.00)
GFR calc Af Amer: 109 mL/min/{1.73_m2} (ref >59)
GFR calc non Af Amer: 94 mL/min/{1.73_m2} (ref >59)
Glucose: 95 mg/dL (ref 65–99)
Potassium: 3.8 mmol/L (ref 3.5–5.2)
SODIUM: 143 mmol/L (ref 134–144)

## 2018-10-14 LAB — CBC WITH DIFFERENTIAL/PLATELET
BASOS ABS: 0 10*3/uL (ref 0.0–0.2)
Basos: 1 %
EOS (ABSOLUTE): 0.1 10*3/uL (ref 0.0–0.4)
Eos: 1 %
HEMOGLOBIN: 11.6 g/dL (ref 11.1–15.9)
Hematocrit: 34.8 % (ref 34.0–46.6)
IMMATURE GRANS (ABS): 0 10*3/uL (ref 0.0–0.1)
IMMATURE GRANULOCYTES: 0 %
LYMPHS: 31 %
Lymphocytes Absolute: 2.5 10*3/uL (ref 0.7–3.1)
MCH: 27.3 pg (ref 26.6–33.0)
MCHC: 33.3 g/dL (ref 31.5–35.7)
MCV: 82 fL (ref 79–97)
MONOCYTES: 8 %
Monocytes Absolute: 0.6 10*3/uL (ref 0.1–0.9)
NEUTROS PCT: 59 %
Neutrophils Absolute: 4.7 10*3/uL (ref 1.4–7.0)
PLATELETS: 293 10*3/uL (ref 150–450)
RBC: 4.25 x10E6/uL (ref 3.77–5.28)
RDW: 13.7 % (ref 12.3–15.4)
WBC: 8 10*3/uL (ref 3.4–10.8)

## 2018-10-14 LAB — LIPID PANEL
CHOL/HDL RATIO: 2.7 ratio (ref 0.0–4.4)
Cholesterol, Total: 128 mg/dL (ref 100–199)
HDL: 47 mg/dL (ref >39)
LDL Calculated: 16 mg/dL (ref 0–99)
TRIGLYCERIDES: 326 mg/dL — AB (ref 0–149)
VLDL Cholesterol Cal: 65 mg/dL — ABNORMAL HIGH (ref 5–40)

## 2018-10-14 LAB — HEPATIC FUNCTION PANEL
ALT: 11 IU/L (ref 0–32)
AST: 12 IU/L (ref 0–40)
Albumin: 4.3 g/dL (ref 3.5–5.5)
Alkaline Phosphatase: 78 IU/L (ref 39–117)
BILIRUBIN TOTAL: 0.2 mg/dL (ref 0.0–1.2)
Bilirubin, Direct: 0.09 mg/dL (ref 0.00–0.40)
Total Protein: 7 g/dL (ref 6.0–8.5)

## 2018-10-14 LAB — MICROALBUMIN / CREATININE URINE RATIO
Creatinine, Urine: 239.2 mg/dL
MICROALB/CREAT RATIO: 33.1 mg/g{creat} — AB (ref 0.0–30.0)
Microalbumin, Urine: 79.2 ug/mL

## 2018-12-08 ENCOUNTER — Encounter: Payer: Self-pay | Admitting: Family Medicine

## 2018-12-08 ENCOUNTER — Ambulatory Visit (INDEPENDENT_AMBULATORY_CARE_PROVIDER_SITE_OTHER): Payer: PRIVATE HEALTH INSURANCE | Admitting: Family Medicine

## 2018-12-08 VITALS — BP 122/88 | Ht 69.0 in | Wt 200.1 lb

## 2018-12-08 DIAGNOSIS — E785 Hyperlipidemia, unspecified: Secondary | ICD-10-CM | POA: Diagnosis not present

## 2018-12-08 DIAGNOSIS — E119 Type 2 diabetes mellitus without complications: Secondary | ICD-10-CM | POA: Diagnosis not present

## 2018-12-08 DIAGNOSIS — I10 Essential (primary) hypertension: Secondary | ICD-10-CM | POA: Diagnosis not present

## 2018-12-08 LAB — POCT GLYCOSYLATED HEMOGLOBIN (HGB A1C): Hemoglobin A1C: 4.9 % (ref 4.0–5.6)

## 2018-12-08 NOTE — Patient Instructions (Addendum)
Stop Glipizide, check blood sugars randomly over the next month and send [place] those results via MyChart.    Diabetes Mellitus and Nutrition, Adult When you have diabetes (diabetes mellitus), it is very important to have healthy eating habits because your blood sugar (glucose) levels are greatly affected by what you eat and drink. Eating healthy foods in the appropriate amounts, at about the same times every day, can help you:  Control your blood glucose.  Lower your risk of heart disease.  Improve your blood pressure.  Reach or maintain a healthy weight. Every person with diabetes is different, and each person has different needs for a meal plan. Your health care provider may recommend that you work with a diet and nutrition specialist (dietitian) to make a meal plan that is best for you. Your meal plan may vary depending on factors such as:  The calories you need.  The medicines you take.  Your weight.  Your blood glucose, blood pressure, and cholesterol levels.  Your activity level.  Other health conditions you have, such as heart or kidney disease. How do carbohydrates affect me? Carbohydrates, also called carbs, affect your blood glucose level more than any other type of food. Eating carbs naturally raises the amount of glucose in your blood. Carb counting is a method for keeping track of how many carbs you eat. Counting carbs is important to keep your blood glucose at a healthy level, especially if you use insulin or take certain oral diabetes medicines. It is important to know how many carbs you can safely have in each meal. This is different for every person. Your dietitian can help you calculate how many carbs you should have at each meal and for each snack. Foods that contain carbs include:  Bread, cereal, rice, pasta, and crackers.  Potatoes and corn.  Peas, beans, and lentils.  Milk and yogurt.  Fruit and juice.  Desserts, such as cakes, cookies, ice cream, and  candy. How does alcohol affect me? Alcohol can cause a sudden decrease in blood glucose (hypoglycemia), especially if you use insulin or take certain oral diabetes medicines. Hypoglycemia can be a life-threatening condition. Symptoms of hypoglycemia (sleepiness, dizziness, and confusion) are similar to symptoms of having too much alcohol. If your health care provider says that alcohol is safe for you, follow these guidelines:  Limit alcohol intake to no more than 1 drink per day for nonpregnant women and 2 drinks per day for men. One drink equals 12 oz of beer, 5 oz of wine, or 1 oz of hard liquor.  Do not drink on an empty stomach.  Keep yourself hydrated with water, diet soda, or unsweetened iced tea.  Keep in mind that regular soda, juice, and other mixers may contain a lot of sugar and must be counted as carbs. What are tips for following this plan?  Reading food labels  Start by checking the serving size on the "Nutrition Facts" label of packaged foods and drinks. The amount of calories, carbs, fats, and other nutrients listed on the label is based on one serving of the item. Many items contain more than one serving per package.  Check the total grams (g) of carbs in one serving. You can calculate the number of servings of carbs in one serving by dividing the total carbs by 15. For example, if a food has 30 g of total carbs, it would be equal to 2 servings of carbs.  Check the number of grams (g) of saturated and trans  fats in one serving. Choose foods that have low or no amount of these fats.  Check the number of milligrams (mg) of salt (sodium) in one serving. Most people should limit total sodium intake to less than 2,300 mg per day.  Always check the nutrition information of foods labeled as "low-fat" or "nonfat". These foods may be higher in added sugar or refined carbs and should be avoided.  Talk to your dietitian to identify your daily goals for nutrients listed on the  label. Shopping  Avoid buying canned, premade, or processed foods. These foods tend to be high in fat, sodium, and added sugar.  Shop around the outside edge of the grocery store. This includes fresh fruits and vegetables, bulk grains, fresh meats, and fresh dairy. Cooking  Use low-heat cooking methods, such as baking, instead of high-heat cooking methods like deep frying.  Cook using healthy oils, such as olive, canola, or sunflower oil.  Avoid cooking with butter, cream, or high-fat meats. Meal planning  Eat meals and snacks regularly, preferably at the same times every day. Avoid going long periods of time without eating.  Eat foods high in fiber, such as fresh fruits, vegetables, beans, and whole grains. Talk to your dietitian about how many servings of carbs you can eat at each meal.  Eat 4-6 ounces (oz) of lean protein each day, such as lean meat, chicken, fish, eggs, or tofu. One oz of lean protein is equal to: ? 1 oz of meat, chicken, or fish. ? 1 egg. ?  cup of tofu.  Eat some foods each day that contain healthy fats, such as avocado, nuts, seeds, and fish. Lifestyle  Check your blood glucose regularly.  Exercise regularly as told by your health care provider. This may include: ? 150 minutes of moderate-intensity or vigorous-intensity exercise each week. This could be brisk walking, biking, or water aerobics. ? Stretching and doing strength exercises, such as yoga or weightlifting, at least 2 times a week.  Take medicines as told by your health care provider.  Do not use any products that contain nicotine or tobacco, such as cigarettes and e-cigarettes. If you need help quitting, ask your health care provider.  Work with a Social worker or diabetes educator to identify strategies to manage stress and any emotional and social challenges. Questions to ask a health care provider  Do I need to meet with a diabetes educator?  Do I need to meet with a dietitian?  What  number can I call if I have questions?  When are the best times to check my blood glucose? Where to find more information:  American Diabetes Association: diabetes.org  Academy of Nutrition and Dietetics: www.eatright.[employer] of Diabetes and Digestive and Kidney Diseases (NIH): [employer] Summary  A healthy meal plan will help you control your blood glucose and maintain a healthy lifestyle.  Working with a diet and nutrition specialist (dietitian) can help you make a meal plan that is best for you.  Keep in mind that carbohydrates (carbs) and alcohol have immediate effects on your blood glucose levels. It is important to count carbs and to use alcohol carefully. This information is not intended to replace advice given to you by your health care provider. Make sure you discuss any questions you have with your health care provider. Document Released: 07/08/2005 Document Revised: 05/11/2017 Document Reviewed: 11/15/2016 Elsevier Interactive Patient Education  2019 [employer].

## 2018-12-08 NOTE — Progress Notes (Signed)
   Subjective:    Patient ID: Leanor Kail, female    DOB: Feb 20, 1971, 48 y.o.   MRN: [id number]  HPI Patient is here today to follow up on her chronic health issues.  Hypertension : takes Norvasc 10 mg once per day, Hctz 25 mg one a day. BP at home runs 120s/80s. No adverse effects.   Diabetes: takes Glipizide 5 mg 1/2 one per day. Checking blood sugars at home: typically 100-120 fasting. Denies hypoglycemia.  No paresthesias. Last eye exam May 2019 - normal per pt.   Hyperlipidemia: Pravastatin 20 mg once per day. No adverse effects.   She eats healthy and gets some exercise.  She does not see any specialists.  Results for orders placed or performed in visit on 12/08/18  POCT glycosylated hemoglobin (Hb A1C)  Result Value Ref Range   Hemoglobin A1C 4.9 4.0 - 5.6 %   HbA1c POC (<> result, manual entry)     HbA1c, POC (prediabetic range)     HbA1c, POC (controlled diabetic range)       Review of Systems  Constitutional: Negative for unexpected weight change.  Eyes: Negative for visual disturbance.  Respiratory: Negative for shortness of breath.   Cardiovascular: Negative for chest pain, palpitations and leg swelling.  Musculoskeletal: Negative for myalgias.  Neurological: Negative for syncope, weakness and headaches.       Objective:   Physical Exam Vitals signs and nursing note reviewed.  Constitutional:      General: She is not in acute distress.    Appearance: Normal appearance. She is well-developed. She is not toxic-appearing.  HENT:     Head: Normocephalic and atraumatic.  Neck:     Musculoskeletal: Neck supple.  Cardiovascular:     Rate and Rhythm: Normal rate and regular rhythm.     Heart sounds: Normal heart sounds. No murmur.  Pulmonary:     Effort: Pulmonary effort is normal. No respiratory distress.     Breath sounds: Normal breath sounds.  Musculoskeletal:     Right lower leg: No edema.     Left lower leg: No edema.  Skin:    General: Skin is warm  and dry.  Neurological:     Mental Status: She is alert and oriented to person, place, and time.  Psychiatric:        Mood and Affect: Mood normal.        Behavior: Behavior normal.           Assessment & Plan:  1. Type 2 diabetes mellitus without complication, without long-term current use of insulin ([place]) - Plan: POCT glycosylated hemoglobin (Hb A1C) A1c today 4.9.  Diabetes under good control.  Recommend discontinuing glipizide.  Patient will monitor blood sugars at home and send [place] record of them over the next month to see how well her diabetes is controlled with diet and exercise alone.  Encouraged continued healthy diet and exercise.  If blood sugars over the next month are stable recommend follow-up in 6 months.  2. Essential hypertension Blood pressure under good control today and based on patient reported home blood pressure readings.  Recommend continuing same therapy.  Follow-up in 6 months.  3. Hyperlipidemia, unspecified hyperlipidemia type Patient doing well on current dose of medication.  No adverse effects.  Follow-up in 6 months.

## 2019-01-16 ENCOUNTER — Telehealth: Payer: Self-pay | Admitting: Family Medicine

## 2019-01-16 NOTE — Telephone Encounter (Signed)
Fax from pharmacy needing new script for Temazepam 15 mg capsule. Take one capsule by mouth every night at bedtime as needed for sleep- not for daily use. Last seen 12/08/2018 for DM

## 2019-01-17 ENCOUNTER — Other Ambulatory Visit: Payer: Self-pay | Admitting: *Deleted

## 2019-01-17 MED ORDER — TEMAZEPAM 15 MG PO CAPS: ORAL_CAPSULE | ORAL | 5 refills | Status: DC

## 2019-01-17 NOTE — Telephone Encounter (Signed)
Med sent to pharm. Pt notified.  

## 2019-01-17 NOTE — Telephone Encounter (Signed)
Six mo worth 

## 2019-03-07 ENCOUNTER — Other Ambulatory Visit: Payer: Self-pay

## 2019-03-07 MED ORDER — HYDROCHLOROTHIAZIDE 25 MG PO TABS: 25.0000 mg | ORAL_TABLET | Freq: Every day | ORAL | 1 refills | Status: DC

## 2019-03-07 MED ORDER — PRAVASTATIN SODIUM 20 MG PO TABS: 20.0000 mg | ORAL_TABLET | Freq: Every day | ORAL | 1 refills | Status: DC

## 2019-03-14 ENCOUNTER — Telehealth: Payer: Self-pay | Admitting: Family Medicine

## 2019-03-14 MED ORDER — AMLODIPINE BESYLATE 10 MG PO TABS: 10.0000 mg | ORAL_TABLET | Freq: Every day | ORAL | 0 refills | Status: DC

## 2019-03-14 NOTE — Telephone Encounter (Signed)
Three mo ok 

## 2019-03-14 NOTE — Telephone Encounter (Signed)
90 day supply sent to pharmacy and pt is aware

## 2019-03-14 NOTE — Telephone Encounter (Signed)
Pt is requesting refill on amLODipine (NORVASC) 10 MG tablet. She is completely out of medication and the pharmacy told her they have requested it twice.   Please send to [place], [place] [address]

## 2019-03-14 NOTE — Telephone Encounter (Signed)
Last seen 12/08/2018 for DM. Please advise. Thank you

## 2019-03-16 ENCOUNTER — Encounter: Payer: Self-pay | Admitting: Family Medicine

## 2019-03-16 ENCOUNTER — Ambulatory Visit (INDEPENDENT_AMBULATORY_CARE_PROVIDER_SITE_OTHER): Payer: PRIVATE HEALTH INSURANCE | Admitting: Family Medicine

## 2019-03-16 ENCOUNTER — Other Ambulatory Visit: Payer: Self-pay

## 2019-03-16 VITALS — BP 124/82 | Temp 97.8°F | Wt 200.2 lb

## 2019-03-16 DIAGNOSIS — M545 Low back pain: Secondary | ICD-10-CM | POA: Diagnosis not present

## 2019-03-16 DIAGNOSIS — S39012A Strain of muscle, fascia and tendon of lower back, initial encounter: Secondary | ICD-10-CM

## 2019-03-16 MED ORDER — ETODOLAC 400 MG PO TABS: 400.0000 mg | ORAL_TABLET | Freq: Two times a day (BID) | ORAL | 0 refills | Status: DC

## 2019-03-16 MED ORDER — HYDROCODONE-ACETAMINOPHEN 5-325 MG PO TABS: ORAL_TABLET | ORAL | 0 refills | Status: DC

## 2019-03-16 NOTE — Progress Notes (Signed)
   Subjective:    Patient ID: Tracy Kelley, female    DOB: 24-Nov-1970, 48 y.o.   MRN: [id number]  HPI Patient arrives with back pain going into her left leg for 2 weeks. Patient recalls no injury   tyleno  Prn     Pt notes fairly severe pain  frpm the hip into the foot seve in nature  numbnes and tingling senation  sugars lately overall good,   Describes pain particularly difficult in the left lower lumbar region.  Worse with twisting.  Has achiness that radiates down to the left knee.  Not the arm.  No weakness.  Fairly substantial at night.  Not that bad when she first gets up and moves around  Review of Systems No headache, no major weight loss or weight gain, no chest pain  abdominal pain no change in bowel habits complete ROS otherwise negative     Objective:   Physical Exam  Alert vitals stable, NAD. Blood pressure good on repeat. HEENT normal. Lungs clear. Heart regular rate and rhythm. Negative true straight leg raise.  Positive left lumbar tenderness.  No spinal tenderness.  No CVA tenderness.      Assessment & Plan:  Impression lumbar sprain subacute with nerve root irritation doubt neuropathic process discussed trial of anti-inflammatories.  Nighttime stronger pain medicine local measures discussed

## 2019-05-25 LAB — HM DIABETES EYE EXAM

## 2019-06-05 ENCOUNTER — Other Ambulatory Visit: Payer: Self-pay | Admitting: Family Medicine

## 2019-06-13 ENCOUNTER — Other Ambulatory Visit: Payer: Self-pay | Admitting: Family Medicine

## 2019-06-13 NOTE — Telephone Encounter (Signed)
Six mo worth 

## 2019-06-18 ENCOUNTER — Encounter: Payer: Self-pay | Admitting: *Deleted

## 2019-06-22 ENCOUNTER — Other Ambulatory Visit (HOSPITAL_COMMUNITY): Payer: Self-pay | Admitting: Family Medicine

## 2019-06-22 DIAGNOSIS — Z1231 Encounter for screening mammogram for malignant neoplasm of breast: Secondary | ICD-10-CM

## 2019-08-06 ENCOUNTER — Other Ambulatory Visit: Payer: Self-pay

## 2019-08-06 ENCOUNTER — Ambulatory Visit (HOSPITAL_COMMUNITY)
Admission: RE | Admit: 2019-08-06 | Discharge: 2019-08-06 | Disposition: A | Discharge status: Home / Self Care | Payer: PRIVATE HEALTH INSURANCE | Source: Ambulatory Visit | Attending: Family Medicine | Admitting: Family Medicine

## 2019-08-06 ENCOUNTER — Ambulatory Visit (HOSPITAL_COMMUNITY): Payer: PRIVATE HEALTH INSURANCE

## 2019-08-06 DIAGNOSIS — Z1231 Encounter for screening mammogram for malignant neoplasm of breast: Secondary | ICD-10-CM | POA: Insufficient documentation

## 2019-09-14 ENCOUNTER — Other Ambulatory Visit: Payer: Self-pay

## 2019-09-14 ENCOUNTER — Encounter: Payer: Self-pay | Admitting: Nurse Practitioner

## 2019-09-14 ENCOUNTER — Ambulatory Visit (INDEPENDENT_AMBULATORY_CARE_PROVIDER_SITE_OTHER): Payer: PRIVATE HEALTH INSURANCE | Admitting: Nurse Practitioner

## 2019-09-14 VITALS — BP 134/90 | Temp 97.9°F | Ht 69.0 in | Wt 193.0 lb

## 2019-09-14 DIAGNOSIS — E119 Type 2 diabetes mellitus without complications: Secondary | ICD-10-CM | POA: Diagnosis not present

## 2019-09-14 DIAGNOSIS — E785 Hyperlipidemia, unspecified: Secondary | ICD-10-CM | POA: Diagnosis not present

## 2019-09-14 DIAGNOSIS — Z0000 Encounter for general adult medical examination without abnormal findings: Secondary | ICD-10-CM

## 2019-09-14 DIAGNOSIS — R5383 Other fatigue: Secondary | ICD-10-CM

## 2019-09-14 DIAGNOSIS — Z01419 Encounter for gynecological examination (general) (routine) without abnormal findings: Secondary | ICD-10-CM

## 2019-09-14 LAB — POCT GLYCOSYLATED HEMOGLOBIN (HGB A1C): Hemoglobin A1C: 5.3 % (ref 4.0–5.6)
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
## 2019-09-14 NOTE — Progress Notes (Signed)
Subjective:    Patient ID: Tracy Kelley, female    DOB: September 17, 1971, 48 y.o.   MRN: [id number]  HPI The patient comes in today for a wellness visit. Pt trying to eat healthy and limit sugar intake. Reports walking a lot through work. Pt sleeps okay-sometimes has to take temazepam for sleep. Pt has regular eye and dental exams. Mammogram up to date. Pt has a cycle mostly every month-lasting 1-2 days with light bleeding. Pt has had months with no cycles. Pt is in a monogamous relationship with partner. No concern for STI. Pt does not use birth control. Pt denies any alcohol or tobacco use.  A review of their health history was completed.  A review of medications was also completed.  Any needed refills: none  Eating habits: good; pt has had decreased appetite due to need root canal   Falls/  MVA accidents in past few months: none  Regular exercise: tries to   Specialist pt sees on regular basis: none  Preventative health issues were discussed.   Additional concerns: none Depression screen [facility] 2/9 09/14/2019 12/08/2018 09/12/2018 09/05/2017 05/05/2017  Decreased Interest 0 0 0 0 0  Down, Depressed, Hopeless 0 0 0 0 0  PHQ - 2 Score 0 0 0 0 0     Review of Systems  Constitutional: Negative for activity change, appetite change, chills and fever.  HENT: Negative for ear pain, sinus pain and sneezing.   Respiratory: Negative for cough, shortness of breath and wheezing.   Cardiovascular: Negative for chest pain, palpitations and leg swelling.  Gastrointestinal: Negative for abdominal pain, blood in stool, constipation, diarrhea and vomiting.  Genitourinary: Negative for difficulty urinating, dysuria, frequency, genital sores, pelvic pain, vaginal bleeding and vaginal discharge.  Musculoskeletal: Negative for back pain and myalgias.  Neurological: Negative for dizziness, syncope, weakness and light-headedness.  Psychiatric/Behavioral: Negative for confusion and sleep disturbance.     Objective:   Physical Exam Exam conducted with a chaperone present.  Constitutional:      Appearance: Normal appearance.  Neck:     Comments: Slightly enlarged thyroid, non-tender, no nodules/masses palpated; no pain when swallowing Cardiovascular:     Rate and Rhythm: Normal rate and regular rhythm.     Heart sounds: No murmur.  Pulmonary:     Effort: No respiratory distress.     Breath sounds: Normal breath sounds. No stridor. No wheezing or rhonchi.  Chest:     Chest wall: No mass, lacerations, swelling or tenderness.     Breasts:        Right: Normal. No swelling, bleeding, inverted nipple or mass.        Left: Normal. No swelling, bleeding, inverted nipple or mass.  Abdominal:     General: Abdomen is flat.     Palpations: There is no mass.     Tenderness: There is no abdominal tenderness. There is no rebound.     Hernia: No hernia is present.  Genitourinary:    Exam position: Lithotomy position.     Comments: External: No rashes or lesion. Vagina pink and moist. Internal: No CMT. No discharge or bleeding present.No masses or tenderness during bimanual exam. Lymphadenopathy:     Upper Body:     Right upper body: No supraclavicular, axillary or pectoral adenopathy.     Left upper body: No supraclavicular, axillary or pectoral adenopathy.  Skin:    General: Skin is warm and dry.  Neurological:     Mental Status: She is alert and  oriented to person, place, and time.  Psychiatric:        Mood and Affect: Mood normal.        Behavior: Behavior normal.     Results for orders placed or performed in visit on 09/14/19  POCT HgB A1C  Result Value Ref Range   Hemoglobin A1C 5.3 4.0 - 5.6 %   HbA1c POC (<> result, manual entry)     HbA1c, POC (prediabetic range)     HbA1c, POC (controlled diabetic range)           Assessment & Plan:   Problem List Items Addressed This Visit      Other   Hyperlipemia   Relevant Orders   TSH   Lipid Profile   COMPLETE METABOLIC  PANEL WITH GFR   Vitamin D (25 hydroxy)   Urine Microalbumin w/creat. ratio    Other Visit Diagnoses    Well woman exam    -  Primary   Relevant Orders   TSH   Lipid Profile   COMPLETE METABOLIC PANEL WITH GFR   Vitamin D (25 hydroxy)   Urine Microalbumin w/creat. ratio   Type 2 diabetes mellitus without complication, without long-term current use of insulin (HCC)       Relevant Orders   POCT HgB A1C (Completed)   TSH   Lipid Profile   COMPLETE METABOLIC PANEL WITH GFR   Vitamin D (25 hydroxy)   Urine Microalbumin w/creat. ratio   Fatigue, unspecified type       Relevant Orders   TSH   Lipid Profile   COMPLETE METABOLIC PANEL WITH GFR   Vitamin D (25 hydroxy)   Urine Microalbumin w/creat. ratio      -Recommend continued healthy diet and lifestyle modifications. Educated about colonoscopy beginning at 40; pt has no family history or risk factors. Mammogram up to date. Pt has received flu vaccine. Lab work ordered.   Return in about 6 months (around 03/13/2020) for diabetes/hypertension. I have seen and examined this patient alongside the NP student and agree with the  assessment and plan.   Pearson Forster, FNP

## 2019-09-25 ENCOUNTER — Other Ambulatory Visit: Payer: Self-pay | Admitting: Family Medicine

## 2019-09-25 NOTE — Telephone Encounter (Signed)
Ok plus 2 ref 

## 2019-09-27 LAB — LIPID PANEL
Chol/HDL Ratio: 12.9 ratio — ABNORMAL HIGH (ref 0.0–4.4)
Cholesterol, Total: 335 mg/dL — ABNORMAL HIGH (ref 100–199)
HDL: 26 mg/dL — ABNORMAL LOW (ref >39)
Triglycerides: 2511 mg/dL (ref 0–149)

## 2019-09-27 LAB — MICROALBUMIN / CREATININE URINE RATIO
Creatinine, Urine: 322.2 mg/dL
Microalb/Creat Ratio: 11 mg/g creat (ref 0–29)
Microalbumin, Urine: 35 ug/mL

## 2019-09-27 LAB — VITAMIN D 25 HYDROXY (VIT D DEFICIENCY, FRACTURES)

## 2019-09-27 LAB — TSH: TSH: 1.34 u[IU]/mL (ref 0.450–4.500)

## 2019-09-28 ENCOUNTER — Other Ambulatory Visit: Payer: Self-pay | Admitting: Nurse Practitioner

## 2019-09-28 MED ORDER — FENOFIBRATE 160 MG PO TABS: 160.0000 mg | ORAL_TABLET | Freq: Every day | ORAL | 2 refills | Status: DC

## 2019-09-28 NOTE — Addendum Note (Signed)
Addended by: Dairl Ponder on: 09/28/2019 04:36 PM   Modules accepted: Orders

## 2019-10-18 ENCOUNTER — Other Ambulatory Visit: Payer: Self-pay

## 2019-10-18 ENCOUNTER — Ambulatory Visit: Payer: PRIVATE HEALTH INSURANCE | Attending: Internal Medicine

## 2019-10-18 DIAGNOSIS — Z20822 Contact with and (suspected) exposure to covid-19: Secondary | ICD-10-CM

## 2019-10-19 LAB — NOVEL CORONAVIRUS, NAA: SARS-CoV-2, NAA: NOT DETECTED

## 2019-11-28 ENCOUNTER — Encounter: Payer: Self-pay | Admitting: Family Medicine

## 2019-12-07 ENCOUNTER — Other Ambulatory Visit: Payer: Self-pay | Admitting: Family Medicine

## 2019-12-07 ENCOUNTER — Telehealth: Payer: Self-pay | Admitting: Family Medicine

## 2019-12-07 LAB — COMPREHENSIVE METABOLIC PANEL
ALT: 12 IU/L (ref 0–32)
AST: 21 IU/L (ref 0–40)
Albumin/Globulin Ratio: 1.5 (ref 1.2–2.2)
Albumin: 4.5 g/dL (ref 3.8–4.8)
Alkaline Phosphatase: 85 IU/L (ref 39–117)
BUN/Creatinine Ratio: 8 — ABNORMAL LOW (ref 9–23)
BUN: 9 mg/dL (ref 6–24)
Bilirubin Total: 0.4 mg/dL (ref 0.0–1.2)
CO2: 23 mmol/L (ref 20–29)
Calcium: 10.4 mg/dL — ABNORMAL HIGH (ref 8.7–10.2)
Chloride: 98 mmol/L (ref 96–106)
Creatinine, Ser: 1.07 mg/dL — ABNORMAL HIGH (ref 0.57–1.00)
GFR calc Af Amer: 71 mL/min/{1.73_m2} (ref >59)
GFR calc non Af Amer: 62 mL/min/{1.73_m2} (ref >59)
Globulin, Total: 3.1 g/dL (ref 1.5–4.5)
Glucose: 110 mg/dL — ABNORMAL HIGH (ref 65–99)
Potassium: 3.7 mmol/L (ref 3.5–5.2)
Sodium: 140 mmol/L (ref 134–144)
Total Protein: 7.6 g/dL (ref 6.0–8.5)

## 2019-12-07 LAB — LIPID PANEL
Chol/HDL Ratio: 3.2 ratio (ref 0.0–4.4)
Cholesterol, Total: 171 mg/dL (ref 100–199)
HDL: 53 mg/dL (ref >39)
LDL Chol Calc (NIH): 71 mg/dL (ref 0–99)
Triglycerides: 298 mg/dL — ABNORMAL HIGH (ref 0–149)
VLDL Cholesterol Cal: 47 mg/dL — ABNORMAL HIGH (ref 5–40)

## 2019-12-07 LAB — HEPATIC FUNCTION PANEL
ALT: 11 IU/L (ref 0–32)
AST: 22 IU/L (ref 0–40)
Albumin: 4.5 g/dL (ref 3.8–4.8)
Alkaline Phosphatase: 85 IU/L (ref 39–117)
Bilirubin Total: 0.4 mg/dL (ref 0.0–1.2)
Bilirubin, Direct: 0.17 mg/dL (ref 0.00–0.40)
Total Protein: 7.5 g/dL (ref 6.0–8.5)

## 2019-12-07 LAB — LIPASE: Lipase: 51 U/L (ref 14–72)

## 2019-12-07 NOTE — Telephone Encounter (Signed)
Pt has reviewed lab work on [employer] and would like the nurse to call and explain her lab work to her.

## 2019-12-08 NOTE — Telephone Encounter (Signed)
Done. See lab report.

## 2019-12-10 NOTE — Telephone Encounter (Signed)
Please contact patient to set up appt; then may route back to nurses. Thank you  

## 2019-12-11 NOTE — Telephone Encounter (Signed)
Scheduled 4/13  Also needing refill on medication to help with sleeping at night.   Does pt also need to continue taking fenofirbrate

## 2019-12-26 ENCOUNTER — Other Ambulatory Visit: Payer: Self-pay | Admitting: Nurse Practitioner

## 2020-02-05 ENCOUNTER — Other Ambulatory Visit: Payer: Self-pay

## 2020-02-05 ENCOUNTER — Ambulatory Visit (INDEPENDENT_AMBULATORY_CARE_PROVIDER_SITE_OTHER): Payer: PRIVATE HEALTH INSURANCE | Admitting: Family Medicine

## 2020-02-05 DIAGNOSIS — R7303 Prediabetes: Secondary | ICD-10-CM | POA: Diagnosis not present

## 2020-02-05 DIAGNOSIS — I10 Essential (primary) hypertension: Secondary | ICD-10-CM | POA: Diagnosis not present

## 2020-02-05 DIAGNOSIS — E785 Hyperlipidemia, unspecified: Secondary | ICD-10-CM | POA: Diagnosis not present

## 2020-02-05 DIAGNOSIS — G4700 Insomnia, unspecified: Secondary | ICD-10-CM

## 2020-02-05 MED ORDER — TEMAZEPAM 15 MG PO CAPS: ORAL_CAPSULE | ORAL | 5 refills | Status: DC

## 2020-02-05 MED ORDER — HYDROCHLOROTHIAZIDE 25 MG PO TABS: ORAL_TABLET | ORAL | 1 refills | Status: DC

## 2020-02-05 MED ORDER — FENOFIBRATE 160 MG PO TABS: ORAL_TABLET | ORAL | 1 refills | Status: DC

## 2020-02-05 MED ORDER — AMLODIPINE BESYLATE 10 MG PO TABS: ORAL_TABLET | ORAL | 1 refills | Status: DC

## 2020-02-05 NOTE — Progress Notes (Signed)
Subjective:  Audio plus video  Patient ID: Tracy Kelley, female    DOB: 09/29/1971, 49 y.o.   MRN: [id number]  HPI Pt is needing refills on all chronic medications. Pt states she is taking all medication as prescribed. No issues or concerns.  Virtual Visit via Telephone Note  I connected with Leanor Kail on 02/05/20 at  3:00 PM EDT by telephone and verified that I am speaking with the correct person using two identifiers.  Location: Patient: home Provider: office   I discussed the limitations, risks, security and privacy concerns of performing an evaluation and management service by telephone and the availability of in person appointments. I also discussed with the patient that there may be a patient responsible charge related to this service. The patient expressed understanding and agreed to proceed.   History of Present Illness:    Observations/Objective:   Assessment and Plan:   Follow Up Instructions:    I discussed the assessment and treatment plan with the patient. The patient was provided an opportunity to ask questions and all were answered. The patient agreed with the plan and demonstrated an understanding of the instructions.   The patient was advised to call back or seek an in-person evaluation if the symptoms worsen or if the condition fails to improve as anticipated.  I provided 3minutes of non-face-to-face time during this encounter.  Blood pressure medicine and blood pressure levels reviewed today with patient. Compliant with blood pressure medicine. States does not miss a dose. No obvious side effects. Blood pressure generally good when checked elsewhere. Watching salt intake.  Patient continues to take lipid medication regularly. No obvious side effects from it. Generally does not miss a dose. Prior blood work results are reviewed with patient. Patient continues to work on fat intake in diet  Patient compliant with insomnia medication. Generally takes most  nights. No obvious morning drowsiness. Definitely helps patient sleep. Without it patient states would not get a good nights rest.  Recent Results (from the past 2160 hour(s))  Lipid Profile     Status: Abnormal   Collection Time: 12/06/19  8:36 AM  Result Value Ref Range   Cholesterol, Total 171 100 - 199 mg/dL   Triglycerides 298 (H) 0 - 149 mg/dL   HDL 53 >39 mg/dL   VLDL Cholesterol Cal 47 (H) 5 - 40 mg/dL   LDL Chol Calc (NIH) 71 0 - 99 mg/dL   Chol/HDL Ratio 3.2 0.0 - 4.4 ratio    Comment:                                   T. Chol/HDL Ratio                                             Men  Women                               1/2 Avg.Risk  3.4    3.3                                   Avg.Risk  5.0    4.4  2X Avg.Risk  9.6    7.1                                3X Avg.Risk 23.4   11.0   Hepatic function panel     Status: None   Collection Time: 12/06/19  8:36 AM  Result Value Ref Range   Total Protein 7.5 6.0 - 8.5 g/dL   Albumin 4.5 3.8 - 4.8 g/dL   Bilirubin Total 0.4 0.0 - 1.2 mg/dL   Bilirubin, Direct 0.17 0.00 - 0.40 mg/dL   Alkaline Phosphatase 85 39 - 117 IU/L   AST 22 0 - 40 IU/L   ALT 11 0 - 32 IU/L  Comprehensive metabolic panel     Status: Abnormal   Collection Time: 12/06/19  8:37 AM  Result Value Ref Range   Glucose 110 (H) 65 - 99 mg/dL   BUN 9 6 - 24 mg/dL   Creatinine, Ser 1.07 (H) 0.57 - 1.00 mg/dL   GFR calc non Af Amer 62 >59 mL/min/1.73   GFR calc Af Amer 71 >59 mL/min/1.73   BUN/Creatinine Ratio 8 (L) 9 - 23   Sodium 140 134 - 144 mmol/L   Potassium 3.7 3.5 - 5.2 mmol/L   Chloride 98 96 - 106 mmol/L   CO2 23 20 - 29 mmol/L   Calcium 10.4 (H) 8.7 - 10.2 mg/dL   Total Protein 7.6 6.0 - 8.5 g/dL   Albumin 4.5 3.8 - 4.8 g/dL   Globulin, Total 3.1 1.5 - 4.5 g/dL   Albumin/Globulin Ratio 1.5 1.2 - 2.2   Bilirubin Total 0.4 0.0 - 1.2 mg/dL   Alkaline Phosphatase 85 39 - 117 IU/L   AST 21 0 - 40 IU/L   ALT 12 0 - 32 IU/L    Lipase     Status: None   Collection Time: 12/06/19  8:37 AM  Result Value Ref Range   Lipase 51 14 - 72 U/L     Review of Systems No headache, no major weight loss or weight gain, no chest pain no back pain abdominal pain no change in bowel habits complete ROS otherwise negative     Objective:   Physical Exam   Virtual     Assessment & Plan:  Impression 1 hypertension.  Good control discussed maintain same meds  2.  Insomnia ongoing need for meds meds refilled  3.  Hyperlipidemia.  Blood work from 3 months ago reviewed  4.  Prediabetes.  Diabetes is listed in the chart but I can find no A1c is 6.5 or higher.  Patient has had numerous clinicians managing her over the last 10 years but no obvious diabetes per chart  Follow-up in 6 months.  Diet exercise discussed meds refilled

## 2020-06-09 ENCOUNTER — Other Ambulatory Visit: Payer: Self-pay | Admitting: *Deleted

## 2020-06-09 NOTE — Telephone Encounter (Signed)
Needs appt for insomnia for refill on temazepam.  Dr. Lovena Le

## 2020-06-13 ENCOUNTER — Telehealth: Payer: Self-pay | Admitting: Family Medicine

## 2020-06-13 MED ORDER — TEMAZEPAM 15 MG PO CAPS: ORAL_CAPSULE | ORAL | 0 refills | Status: DC

## 2020-06-13 NOTE — Telephone Encounter (Signed)
Pt requested a refill on Med for temazepam (RESTORIL) 15 MG capsule early in the week Pharmacy has not received refill.  Pt call back  [phone]

## 2020-06-13 NOTE — Telephone Encounter (Signed)
Med Follow Up on 09/23 @ 9:00 a.m.

## 2020-06-13 NOTE — Telephone Encounter (Signed)
Please advise. Thank you

## 2020-06-13 NOTE — Telephone Encounter (Signed)
Med faxed and pt notified.

## 2020-07-01 ENCOUNTER — Other Ambulatory Visit (HOSPITAL_COMMUNITY): Payer: Self-pay | Admitting: Family Medicine

## 2020-07-01 DIAGNOSIS — Z1231 Encounter for screening mammogram for malignant neoplasm of breast: Secondary | ICD-10-CM

## 2020-07-04 ENCOUNTER — Telehealth: Payer: Self-pay | Admitting: Orthopaedic Surgery

## 2020-07-04 NOTE — Telephone Encounter (Signed)
Tracy Kelley brought her xrays to the office along with the xray report for the doctors to review earlier this week.  She told me that doctor notes were in the folder along with the x-rays but they were not.  I called Tracy Kelley back and told her that the doctors would need to review those.  She said she would call and have them sent to [place].  After a couple of days of unsuccessful attempts from Dr. Tanna Furry office to send those notes to [place], Tracy Kelley brought the notes in today so that they can be reviewed next week.  We will await their response

## 2020-07-08 NOTE — Telephone Encounter (Signed)
I spoke back with Tracy Kelley today and have scheduled her for this coming Tuesday, 07/15/20 at 8:00

## 2020-07-15 ENCOUNTER — Ambulatory Visit (INDEPENDENT_AMBULATORY_CARE_PROVIDER_SITE_OTHER): Payer: PRIVATE HEALTH INSURANCE | Admitting: Orthopaedic Surgery

## 2020-07-15 ENCOUNTER — Other Ambulatory Visit: Payer: Self-pay

## 2020-07-15 ENCOUNTER — Encounter: Payer: Self-pay | Admitting: Orthopaedic Surgery

## 2020-07-15 VITALS — BP 149/96 | HR 100 | Ht 69.0 in | Wt 217.0 lb

## 2020-07-15 DIAGNOSIS — M545 Low back pain, unspecified: Secondary | ICD-10-CM

## 2020-07-15 MED ORDER — NAPROXEN 500 MG PO TABS: 500.0000 mg | ORAL_TABLET | Freq: Two times a day (BID) | ORAL | 5 refills | Status: DC

## 2020-07-15 MED ORDER — HYDROCODONE-ACETAMINOPHEN 5-325 MG PO TABS: 1.0000 | ORAL_TABLET | ORAL | 0 refills | Status: AC | PRN
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
## 2020-07-17 ENCOUNTER — Encounter: Payer: Self-pay | Admitting: Family Medicine

## 2020-07-17 ENCOUNTER — Ambulatory Visit (INDEPENDENT_AMBULATORY_CARE_PROVIDER_SITE_OTHER): Payer: PRIVATE HEALTH INSURANCE | Admitting: Family Medicine

## 2020-07-17 ENCOUNTER — Other Ambulatory Visit: Payer: Self-pay

## 2020-07-17 VITALS — BP 130/80 | HR 92 | Temp 98.1°F | Ht 69.0 in | Wt 217.8 lb

## 2020-07-17 DIAGNOSIS — E559 Vitamin D deficiency, unspecified: Secondary | ICD-10-CM

## 2020-07-17 DIAGNOSIS — E119 Type 2 diabetes mellitus without complications: Secondary | ICD-10-CM

## 2020-07-17 DIAGNOSIS — E049 Nontoxic goiter, unspecified: Secondary | ICD-10-CM

## 2020-07-17 DIAGNOSIS — E785 Hyperlipidemia, unspecified: Secondary | ICD-10-CM

## 2020-07-17 DIAGNOSIS — I10 Essential (primary) hypertension: Secondary | ICD-10-CM | POA: Diagnosis not present

## 2020-07-17 DIAGNOSIS — E040 Nontoxic diffuse goiter: Secondary | ICD-10-CM

## 2020-07-17 DIAGNOSIS — M5416 Radiculopathy, lumbar region: Secondary | ICD-10-CM

## 2020-07-17 MED ORDER — FENOFIBRATE 160 MG PO TABS: ORAL_TABLET | ORAL | 1 refills | Status: DC

## 2020-07-17 MED ORDER — HYDROCHLOROTHIAZIDE 25 MG PO TABS: ORAL_TABLET | ORAL | 1 refills | Status: DC

## 2020-07-17 MED ORDER — AMLODIPINE BESYLATE 10 MG PO TABS: ORAL_TABLET | ORAL | 1 refills | Status: DC

## 2020-07-17 NOTE — Progress Notes (Signed)
Patient ID: Tracy Kelley, female    DOB: 1971/07/14, 49 y.o.   MRN: [id number]   Chief Complaint  Patient presents with  . Hypertension   Subjective:    HPI   Pt here for medication follow up. Pt is on pain med from [place] regarding sciatica pain. Using Temazepam prn for sleep.  Has prediabetes/dm and seeing around 100s at home.  H/o sciatic nerve pain and seeing Dr. Luna Glasgow.Did pt and chiropractor and nerve pain persisting. And getting MRI soon for the lumbar spine.  Has h/o insomnia and improving. Taking temazepam in past for insomnia. Haven't been taking it since being on the pain meds.   Taking multivitamin with calcium. slightly increase in calcium on last labs. Not on extra tums.  DM2- not on meds . No foot concerns.  Hasn't had eye exam in a while.  Cr- elevated at 1.07.   HTN Pt compliant with BP meds.  No SEs Denies chest pain, sob, LE swelling, or blurry vision.  Has had h/o goiter and had thyroid checked and u/s and was normal.  Had u/s thyroid in past for goiter.  Was on thyroid medication.  Then it resolved.    Medical History [place] has a past medical history of Anxiety, Diabetes mellitus without complication ([place]), Hyperlipidemia, Hypertension, and Thyroid disease.   Outpatient Encounter Medications as of 07/17/2020  Medication Sig  . amLODipine (NORVASC) 10 MG tablet TAKE 1 TABLET(10 MG) BY MOUTH DAILY  . fenofibrate 160 MG tablet TAKE 1 TABLET BY MOUTH EVERY DAY FOR TRIGLYCERIDES  . hydrochlorothiazide (HYDRODIURIL) 25 MG tablet TAKE 1 TABLET(25 MG) BY MOUTH DAILY  . HYDROcodone-acetaminophen (NORCO/VICODIN) 5-325 MG tablet Take 1 tablet by mouth every 4 (four) hours as needed for up to 5 days for moderate pain.  . Multiple Vitamin (MULTIVITAMIN WITH MINERALS) TABS tablet Take 1 tablet by mouth daily.  . naproxen (NAPROSYN) 500 MG tablet Take 1 tablet (500 mg total) by mouth 2 (two) times daily with a meal.  . temazepam (RESTORIL) 15 MG  capsule TAKE 1 CAPSULE BY MOUTH EVERY DAY AT BEDTIME  . [DISCONTINUED] amLODipine (NORVASC) 10 MG tablet TAKE 1 TABLET(10 MG) BY MOUTH DAILY  . [DISCONTINUED] fenofibrate 160 MG tablet TAKE 1 TABLET BY MOUTH EVERY DAY FOR TRIGLYCERIDES  . [DISCONTINUED] hydrochlorothiazide (HYDRODIURIL) 25 MG tablet TAKE 1 TABLET(25 MG) BY MOUTH DAILY  . [DISCONTINUED] acetaminophen (TYLENOL) 500 MG tablet Take 1,000 mg by mouth every 6 (six) hours as needed for moderate pain.  . [DISCONTINUED] cholecalciferol (VITAMIN D3) 25 MCG (1000 UT) tablet Take 1,000 Units by mouth daily.   No facility-administered encounter medications on file as of 07/17/2020.     Review of Systems  Constitutional: Negative for chills and fever.  HENT: Negative for congestion, rhinorrhea and sore throat.   Respiratory: Negative for cough, shortness of breath and wheezing.   Cardiovascular: Negative for chest pain and leg swelling.  Gastrointestinal: Negative for abdominal pain, diarrhea, nausea and vomiting.  Genitourinary: Negative for dysuria and frequency.  Musculoskeletal: Positive for back pain (rt sided, chronic). Negative for arthralgias.  Skin: Negative for rash.  Neurological: Negative for dizziness, weakness and headaches.     Vitals BP 130/80   Pulse 92   Temp 98.1 F (36.7 C)   Ht _0  (1.753 m)   Wt 217 lb 12.8 oz (98.8 kg)   SpO2 96%   BMI 32.16 kg/m   Objective:   Physical Exam Vitals and nursing note reviewed.  Constitutional:      General: She is not in acute distress.    Appearance: Normal appearance.  HENT:     Head: Normocephalic and atraumatic.     Nose: Nose normal.     Mouth/Throat:     Mouth: Mucous membranes are moist.     Pharynx: Oropharynx is clear.  Eyes:     Extraocular Movements: Extraocular movements intact.     Conjunctiva/sclera: Conjunctivae normal.     Pupils: Pupils are equal, round, and reactive to light.  Neck:     Comments: +goiter, supple. Cardiovascular:      Rate and Rhythm: Normal rate and regular rhythm.     Pulses: Normal pulses.     Heart sounds: Normal heart sounds. No murmur heard.   Pulmonary:     Effort: Pulmonary effort is normal. No respiratory distress.     Breath sounds: Normal breath sounds. No wheezing, rhonchi or rales.  Musculoskeletal:        General: Normal range of motion.     Cervical back: Normal range of motion and neck supple. No tenderness.     Right lower leg: No edema.     Left lower leg: No edema.  Skin:    General: Skin is warm and dry.     Findings: No lesion or rash.  Neurological:     General: No focal deficit present.     Mental Status: She is alert and oriented to person, place, and time.     Cranial Nerves: No cranial nerve deficit.  Psychiatric:        Mood and Affect: Mood normal.        Behavior: Behavior normal.        Thought Content: Thought content normal.        Judgment: Judgment normal.      Assessment and Plan   1. Hyperlipidemia, unspecified hyperlipidemia type - CMP14+EGFR - Lipid panel; Future - fenofibrate 160 MG tablet; TAKE 1 TABLET BY MOUTH EVERY DAY FOR TRIGLYCERIDES  Dispense: 90 tablet; Refill: 1  2. Essential hypertension - CMP14+EGFR - amLODipine (NORVASC) 10 MG tablet; TAKE 1 TABLET(10 MG) BY MOUTH DAILY  Dispense: 90 tablet; Refill: 1 - hydrochlorothiazide (HYDRODIURIL) 25 MG tablet; TAKE 1 TABLET(25 MG) BY MOUTH DAILY  Dispense: 90 tablet; Refill: 1  3. Goiter diffuse - TSH  4. Diabetes mellitus type 2 in nonobese (HCC) - CBC - CMP14+EGFR - Hemoglobin A1c - Microalbumin, urine  5. Vitamin D deficiency - VITAMIN D 25 Hydroxy (Vit-D Deficiency, Fractures)  6. Lumbar radiculopathy  7. Hypercalcemia - CMP14+EGFR    htn- stable, cont meds. dm2- diet controlled, cont to monitor. Lumbar pain- cont f/u with ortho. Pt only taking temazepam prn.  Not needing it at this time since on pain medcaition for her back.  Pt to have well woman exam in 12/21. Vit D  def- will recheck labs. Hypercalcemia- cont to increase water intake and will recheck labs. HLD- stable, elevated TG on last visit. will recheck labs,  cont meds.   F/u [employer]mofor [employer] or prn.

## 2020-07-23 ENCOUNTER — Ambulatory Visit: Payer: PRIVATE HEALTH INSURANCE | Admitting: Family Medicine

## 2020-07-25 LAB — CBC
Hematocrit: 38.7 % (ref 34.0–46.6)
Hemoglobin: 12.8 g/dL (ref 11.1–15.9)
MCH: 27.8 pg (ref 26.6–33.0)
MCHC: 33.1 g/dL (ref 31.5–35.7)
MCV: 84 fL (ref 79–97)
Platelets: 318 10*3/uL (ref 150–450)
RBC: 4.6 x10E6/uL (ref 3.77–5.28)
RDW: 13.5 % (ref 11.7–15.4)
WBC: 8.7 10*3/uL (ref 3.4–10.8)

## 2020-07-25 LAB — CMP14+EGFR
ALT: 15 IU/L (ref 0–32)
AST: 20 IU/L (ref 0–40)
Albumin/Globulin Ratio: 1.7 (ref 1.2–2.2)
Albumin: 4.6 g/dL (ref 3.8–4.8)
Alkaline Phosphatase: 73 IU/L (ref 44–121)
BUN/Creatinine Ratio: 11 (ref 9–23)
BUN: 10 mg/dL (ref 6–24)
Bilirubin Total: 0.3 mg/dL (ref 0.0–1.2)
CO2: 25 mmol/L (ref 20–29)
Calcium: 10.3 mg/dL — ABNORMAL HIGH (ref 8.7–10.2)
Chloride: 100 mmol/L (ref 96–106)
Creatinine, Ser: 0.94 mg/dL (ref 0.57–1.00)
GFR calc Af Amer: 83 mL/min/{1.73_m2} (ref >59)
GFR calc non Af Amer: 72 mL/min/{1.73_m2} (ref >59)
Globulin, Total: 2.7 g/dL (ref 1.5–4.5)
Glucose: 153 mg/dL — ABNORMAL HIGH (ref 65–99)
Potassium: 4 mmol/L (ref 3.5–5.2)
Sodium: 140 mmol/L (ref 134–144)
Total Protein: 7.3 g/dL (ref 6.0–8.5)

## 2020-07-25 LAB — MICROALBUMIN, URINE: Microalbumin, Urine: 347.4 ug/mL

## 2020-07-25 LAB — VITAMIN D 25 HYDROXY (VIT D DEFICIENCY, FRACTURES): Vit D, 25-Hydroxy: 35 ng/mL (ref 30.0–100.0)

## 2020-07-25 LAB — TSH: TSH: 0.993 u[IU]/mL (ref 0.450–4.500)

## 2020-07-25 LAB — HEMOGLOBIN A1C
Est. average glucose Bld gHb Est-mCnc: 157 mg/dL
Hgb A1c MFr Bld: 7.1 % — ABNORMAL HIGH (ref 4.8–5.6)

## 2020-07-28 ENCOUNTER — Ambulatory Visit (HOSPITAL_COMMUNITY)
Admission: RE | Admit: 2020-07-28 | Discharge: 2020-07-28 | Disposition: A | Discharge status: Home / Self Care | Payer: PRIVATE HEALTH INSURANCE | Source: Ambulatory Visit | Attending: Orthopaedic Surgery | Admitting: Orthopaedic Surgery

## 2020-07-28 ENCOUNTER — Other Ambulatory Visit: Payer: Self-pay

## 2020-07-28 DIAGNOSIS — M79604 Pain in right leg: Secondary | ICD-10-CM | POA: Diagnosis present

## 2020-07-28 DIAGNOSIS — M545 Low back pain, unspecified: Secondary | ICD-10-CM | POA: Diagnosis not present

## 2020-07-31 ENCOUNTER — Other Ambulatory Visit: Payer: Self-pay

## 2020-07-31 ENCOUNTER — Encounter: Payer: Self-pay | Admitting: Orthopaedic Surgery

## 2020-07-31 ENCOUNTER — Ambulatory Visit (INDEPENDENT_AMBULATORY_CARE_PROVIDER_SITE_OTHER): Payer: PRIVATE HEALTH INSURANCE | Admitting: Orthopaedic Surgery

## 2020-07-31 VITALS — Ht 69.0 in | Wt 217.0 lb

## 2020-07-31 DIAGNOSIS — M5136 Other intervertebral disc degeneration, lumbar region: Secondary | ICD-10-CM

## 2020-07-31 DIAGNOSIS — M79604 Pain in right leg: Secondary | ICD-10-CM | POA: Diagnosis not present

## 2020-07-31 DIAGNOSIS — M545 Low back pain, unspecified: Secondary | ICD-10-CM

## 2020-07-31 MED ORDER — HYDROCODONE-ACETAMINOPHEN 5-325 MG PO TABS: ORAL_TABLET | ORAL | 0 refills | Status: DC
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
## 2020-08-08 ENCOUNTER — Other Ambulatory Visit: Payer: Self-pay

## 2020-08-08 ENCOUNTER — Ambulatory Visit (HOSPITAL_COMMUNITY)
Admission: RE | Admit: 2020-08-08 | Discharge: 2020-08-08 | Disposition: A | Discharge status: Home / Self Care | Payer: PRIVATE HEALTH INSURANCE | Source: Ambulatory Visit | Attending: Family Medicine | Admitting: Family Medicine

## 2020-08-08 DIAGNOSIS — Z1231 Encounter for screening mammogram for malignant neoplasm of breast: Secondary | ICD-10-CM | POA: Diagnosis present

## 2020-08-25 ENCOUNTER — Ambulatory Visit: Payer: Self-pay

## 2020-08-25 ENCOUNTER — Ambulatory Visit (INDEPENDENT_AMBULATORY_CARE_PROVIDER_SITE_OTHER): Payer: PRIVATE HEALTH INSURANCE | Admitting: Physical Medicine and Rehabilitation

## 2020-08-25 ENCOUNTER — Other Ambulatory Visit: Payer: Self-pay

## 2020-08-25 ENCOUNTER — Encounter: Payer: Self-pay | Admitting: Physical Medicine and Rehabilitation

## 2020-08-25 VITALS — BP 130/91 | HR 94

## 2020-08-25 DIAGNOSIS — M5416 Radiculopathy, lumbar region: Secondary | ICD-10-CM

## 2020-08-25 MED ORDER — METHYLPREDNISOLONE ACETATE 80 MG/ML IJ SUSP
80.0000 mg | Freq: Once | INTRAMUSCULAR | Status: AC
  Administered 2020-08-25: 80 mg

## 2020-08-25 NOTE — Progress Notes (Signed)
Pt state right lower back pain that travels down her right leg. Pt state walking and bending over makes the pain worse. Pt state she takes pain meds and use ice packs to help ease the pain.  Numeric Pain Rating Scale and Functional Assessment Average Pain 7   In the last MONTH (on 0-10 scale) has pain interfered with the following?  1. General activity like being  able to carry out your everyday physical activities such as walking, climbing stairs, carrying groceries, or moving a chair?  Rating(9)   +Driver, -BT, -Dye Allergies.

## 2020-08-26 NOTE — Progress Notes (Signed)
Tracy Kelley - 49 y.o. female MRN [id number]  Date of birth: [birth year]  Office Visit Note: Visit Date: 08/25/2020 PCP: Erven Colla, DO Referred by: Erven Colla, DO  Subjective: Chief Complaint  Patient presents with  . Lower Back - Pain  . Right Leg - Pain   HPI:  Tracy Kelley is a 49 y.o. female who comes in today at the request of Dr. Sanjuana Kava for planned Right L4-L5 Lumbar epidural steroid injection with fluoroscopic guidance.  The patient has failed conservative care including home exercise, medications, time and activity modification.  This injection will be diagnostic and hopefully therapeutic.  Please see requesting physician notes for further details and justification.  MRI reviewed with images and spine model.  MRI reviewed in the note below.  Patient concerned today about 2 disc bulges on MRI.  We did show her images and talked about the fact that this bulging is really not an issue at all that we all have that.  I did discuss with her facet arthropathy which is pretty pronounced at L3-4 and L4-5 with some lateral recess narrowing but no central canal narrowing.  I think the issue is the recess narrowing in terms of her pain but also I think she is having some facet joint pain.  Consider facet joint block in the future.  ROS Otherwise per HPI.  Assessment & Plan: Visit Diagnoses:  1. Lumbar radiculopathy     Plan: No additional findings.   Meds & Orders:  Meds ordered this encounter  Medications  . methylPREDNISolone acetate (DEPO-MEDROL) injection 80 mg    Orders Placed This Encounter  Procedures  . XR C-ARM NO REPORT  . Epidural Steroid injection    Follow-up: Return if symptoms worsen or fail to improve.   Procedures: No procedures performed  Lumbar Epidural Steroid Injection - Interlaminar Approach with Fluoroscopic Guidance  Patient: Tracy Kelley      Date of Birth: Apr 21, 1971 MRN: [id number] PCP: Erven Colla, DO      Visit Date:  08/25/2020   Universal Protocol:     Consent Given By: the patient  Position: PRONE  Additional Comments: Vital signs were monitored before and after the procedure. Patient was prepped and draped in the usual sterile fashion. The correct patient, procedure, and site was verified.   Injection Procedure Details:   Procedure diagnoses:  1. Lumbar radiculopathy      Meds Administered:  Meds ordered this encounter  Medications  . methylPREDNISolone acetate (DEPO-MEDROL) injection 80 mg     Laterality: Right  Location/Site:  L4-L5  Needle size: 20 G  Needle type: Tuohy  Needle Placement: Paramedian epidural  Findings:   -Comments: Excellent flow of contrast into the epidural space.  Procedure Details: Using a paramedian approach from the side mentioned above, the region overlying the inferior lamina was localized under fluoroscopic visualization and the soft tissues overlying this structure were infiltrated with 4 ml. of 1% Lidocaine without Epinephrine. The Tuohy needle was inserted into the epidural space using a paramedian approach.   The epidural space was localized using loss of resistance along with counter oblique bi-planar fluoroscopic views.  After negative aspirate for air, blood, and CSF, a 2 ml. volume of Isovue-250 was injected into the epidural space and the flow of contrast was observed. Radiographs were obtained for documentation purposes.    The injectate was administered into the level noted above.   Additional Comments:  The patient tolerated the procedure well  Dressing: 2 x 2 sterile gauze and Band-Aid    Post-procedure details: Patient was observed during the procedure. Post-procedure instructions were reviewed.  Patient left the clinic in stable condition.     Clinical History: MRI LUMBAR SPINE WITHOUT CONTRAST  TECHNIQUE: Multiplanar, multisequence MR imaging of the lumbar spine was performed. No intravenous contrast was  administered.  COMPARISON:  None.  FINDINGS: Segmentation:  Standard.  Alignment:  Minimal anterolisthesis of L3 over L4.  Vertebrae: No fracture, evidence of discitis, or bone lesion. Endplate degenerative changes at L4-5. Large hemangioma within the body and right L3 pedicle.  Conus medullaris and cauda equina: Conus extends to the L1 level. Conus and cauda equina appear normal.  Paraspinal and other soft tissues: Negative.  Disc levels:  T12-L1: No spinal canal or neural foraminal stenosis.  L1-2: No spinal canal or neural foraminal stenosis.  L2-3: Facet degenerative changes. No spinal canal or neural foraminal stenosis.  L3-4: Disc bulge with prominent hypertrophic facet degenerative changes and ligamentum flavum redundancy resulting in mild bilateral neural foraminal narrowing. No significant spinal canal stenosis.  L4-5: Disc bulge, moderate facet degenerative changes and ligamentum flavum redundancy resulting in mild narrowing of the bilateral subarticular zones, mild-to-moderate right and moderate left neural foraminal narrowing.  L5-S1: Mild facet degenerative changes. No spinal canal or neural foraminal stenosis.  IMPRESSION: 1. 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 2. Mild narrowing of the bilateral subarticular zones, mild-to-moderate right and moderate left neural foraminal narrowing at L4-5.   Electronically Signed   By: Pedro Earls M.D.   On: 07/28/2020 18:11     Objective:  VS:  HT:    WT:   BMI:     BP:(!) 130/91  HR:94bpm  TEMP: ( )  RESP:  Physical Exam Constitutional:      General: She is not in acute distress.    Appearance: Normal appearance. She is not ill-appearing.  HENT:     Head: Normocephalic and atraumatic.     Right Ear: External ear normal.     Left Ear: External ear normal.  Eyes:     Extraocular  Movements: Extraocular movements intact.  Cardiovascular:     Rate and Rhythm: Normal rate.     Pulses: Normal pulses.  Musculoskeletal:     Right lower leg: No edema.     Left lower leg: No edema.     Comments: Patient has good distal strength with no pain over the greater trochanters.  No clonus or focal weakness.Patient somewhat slow to rise from a seated position to full extension.  There is concordant low back pain with facet loading and lumbar spine extension rotation.  There are no definitive trigger points but the patient is somewhat tender across the lower back and PSIS.  There is no pain with hip rotation.   Skin:    Findings: No erythema, lesion or rash.  Neurological:     General: No focal deficit present.     Mental Status: She is alert and oriented to person, place, and time.     Sensory: No sensory deficit.     Motor: No weakness or abnormal muscle tone.     Coordination: Coordination normal.  Psychiatric:        Mood and Affect: Mood normal.        Behavior: Behavior normal.      Imaging: XR C-ARM NO REPORT  Result Date: 08/25/2020  Please see Notes tab for imaging impression.

## 2020-08-26 NOTE — Procedures (Signed)
Lumbar Epidural Steroid Injection - Interlaminar Approach with Fluoroscopic Guidance  Patient: Tracy Kelley      Date of Birth: [birth year] MRN: [id number] PCP: Erven Colla, DO      Visit Date: 08/25/2020   Universal Protocol:     Consent Given By: the patient  Position: PRONE  Additional Comments: Vital signs were monitored before and after the procedure. Patient was prepped and draped in the usual sterile fashion. The correct patient, procedure, and site was verified.   Injection Procedure Details:   Procedure diagnoses:  1. Lumbar radiculopathy      Meds Administered:  Meds ordered this encounter  Medications  . methylPREDNISolone acetate (DEPO-MEDROL) injection 80 mg     Laterality: Right  Location/Site:  L4-L5  Needle size: 20 G  Needle type: Tuohy  Needle Placement: Paramedian epidural  Findings:   -Comments: Excellent flow of contrast into the epidural space.  Procedure Details: Using a paramedian approach from the side mentioned above, the region overlying the inferior lamina was localized under fluoroscopic visualization and the soft tissues overlying this structure were infiltrated with 4 ml. of 1% Lidocaine without Epinephrine. The Tuohy needle was inserted into the epidural space using a paramedian approach.   The epidural space was localized using loss of resistance along with counter oblique bi-planar fluoroscopic views.  After negative aspirate for air, blood, and CSF, a 2 ml. volume of Isovue-250 was injected into the epidural space and the flow of contrast was observed. Radiographs were obtained for documentation purposes.    The injectate was administered into the level noted above.   Additional Comments:  The patient tolerated the procedure well Dressing: 2 x 2 sterile gauze and Band-Aid    Post-procedure details: Patient was observed during the procedure. Post-procedure instructions were reviewed.  Patient left the clinic in stable  condition.

## 2020-09-09 ENCOUNTER — Telehealth: Payer: Self-pay

## 2020-09-09 NOTE — Telephone Encounter (Signed)
L3-4 and L4-5 facet to painful side

## 2020-09-09 NOTE — Telephone Encounter (Signed)
Called pt and sch 12/1.

## 2020-09-09 NOTE — Telephone Encounter (Signed)
Patient had right L4-5 IL on 11/1. Please advise.

## 2020-09-09 NOTE — Telephone Encounter (Signed)
Patient called stating she received injection 2 weeks ago for her leg she stated the pain is now in he hip Call [phone]

## 2020-09-09 NOTE — Telephone Encounter (Signed)
Auth# Not req.

## 2020-09-11 ENCOUNTER — Encounter: Payer: Self-pay | Admitting: Orthopaedic Surgery

## 2020-09-11 ENCOUNTER — Other Ambulatory Visit: Payer: Self-pay

## 2020-09-11 ENCOUNTER — Ambulatory Visit (INDEPENDENT_AMBULATORY_CARE_PROVIDER_SITE_OTHER): Payer: PRIVATE HEALTH INSURANCE | Admitting: Orthopaedic Surgery

## 2020-09-11 VITALS — BP 143/94 | HR 113 | Ht 69.0 in | Wt 218.0 lb

## 2020-09-11 DIAGNOSIS — M545 Low back pain, unspecified: Secondary | ICD-10-CM

## 2020-09-11 DIAGNOSIS — M79604 Pain in right leg: Secondary | ICD-10-CM | POA: Diagnosis not present

## 2020-09-11 MED ORDER — HYDROCODONE-ACETAMINOPHEN 5-325 MG PO TABS
ORAL_TABLET | ORAL | 0 refills | Status: DC
Start: 2020-09-11 — End: 2020-09-30
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
## 2020-09-16 ENCOUNTER — Encounter: Payer: PRIVATE HEALTH INSURANCE | Admitting: Family Medicine

## 2020-09-24 ENCOUNTER — Encounter: Payer: Self-pay | Admitting: Physical Medicine and Rehabilitation

## 2020-09-24 ENCOUNTER — Other Ambulatory Visit: Payer: Self-pay

## 2020-09-24 ENCOUNTER — Ambulatory Visit: Payer: Self-pay

## 2020-09-24 ENCOUNTER — Ambulatory Visit: Payer: PRIVATE HEALTH INSURANCE | Admitting: Physical Medicine and Rehabilitation

## 2020-09-24 ENCOUNTER — Ambulatory Visit (INDEPENDENT_AMBULATORY_CARE_PROVIDER_SITE_OTHER): Payer: PRIVATE HEALTH INSURANCE | Admitting: Physical Medicine and Rehabilitation

## 2020-09-24 VITALS — BP 117/82 | HR 96

## 2020-09-24 DIAGNOSIS — M5416 Radiculopathy, lumbar region: Secondary | ICD-10-CM | POA: Diagnosis not present

## 2020-09-24 MED ORDER — BETAMETHASONE SOD PHOS & ACET 6 (3-3) MG/ML IJ SUSP
12.0000 mg | Freq: Once | INTRAMUSCULAR | Status: AC
  Administered 2020-09-24: 12 mg

## 2020-09-24 NOTE — Progress Notes (Signed)
  Numeric Pain Rating Scale and Functional Assessment Average Pain 5   In the last MONTH (on 0-10 scale) has pain interfered with the following?  1. General activity like being  able to carry out your everyday physical activities such as walking, climbing stairs, carrying groceries, or moving a chair?  Rating(8)   +Driver, -BT, -Dye Allergies.  

## 2020-09-29 ENCOUNTER — Telehealth: Payer: Self-pay | Admitting: Orthopaedic Surgery

## 2020-09-29 LAB — HM DIABETES EYE EXAM

## 2020-09-29 NOTE — Telephone Encounter (Signed)
Patient called for refill: HYDROcodone-acetaminophen (NORCO/VICODIN) 5-325 MG tablet 28 tablet  -[employer], [employer]

## 2020-09-30 ENCOUNTER — Encounter: Payer: PRIVATE HEALTH INSURANCE | Admitting: Family Medicine

## 2020-09-30 MED ORDER — HYDROCODONE-ACETAMINOPHEN 5-325 MG PO TABS: ORAL_TABLET | ORAL | 0 refills | Status: DC

## 2020-10-03 ENCOUNTER — Ambulatory Visit (INDEPENDENT_AMBULATORY_CARE_PROVIDER_SITE_OTHER): Payer: PRIVATE HEALTH INSURANCE | Admitting: Family Medicine

## 2020-10-03 ENCOUNTER — Other Ambulatory Visit: Payer: Self-pay

## 2020-10-03 ENCOUNTER — Telehealth: Payer: Self-pay | Admitting: Family Medicine

## 2020-10-03 ENCOUNTER — Other Ambulatory Visit: Payer: Self-pay | Admitting: *Deleted

## 2020-10-03 VITALS — BP 120/78 | HR 98 | Temp 97.4°F | Ht 69.0 in | Wt 209.8 lb

## 2020-10-03 DIAGNOSIS — J029 Acute pharyngitis, unspecified: Secondary | ICD-10-CM | POA: Diagnosis not present

## 2020-10-03 DIAGNOSIS — E119 Type 2 diabetes mellitus without complications: Secondary | ICD-10-CM

## 2020-10-03 DIAGNOSIS — I10 Essential (primary) hypertension: Secondary | ICD-10-CM | POA: Diagnosis not present

## 2020-10-03 DIAGNOSIS — Z01419 Encounter for gynecological examination (general) (routine) without abnormal findings: Secondary | ICD-10-CM

## 2020-10-03 DIAGNOSIS — F5105 Insomnia due to other mental disorder: Secondary | ICD-10-CM

## 2020-10-03 DIAGNOSIS — F419 Anxiety disorder, unspecified: Secondary | ICD-10-CM | POA: Diagnosis not present

## 2020-10-03 DIAGNOSIS — Z124 Encounter for screening for malignant neoplasm of cervix: Secondary | ICD-10-CM

## 2020-10-03 DIAGNOSIS — J020 Streptococcal pharyngitis: Secondary | ICD-10-CM

## 2020-10-03 LAB — POCT RAPID STREP A (OFFICE): Rapid Strep A Screen: POSITIVE — AB

## 2020-10-03 MED ORDER — TEMAZEPAM 15 MG PO CAPS
ORAL_CAPSULE | ORAL | 3 refills | Status: DC
Start: 2020-10-03 — End: 2021-01-20

## 2020-10-03 MED ORDER — GLIPIZIDE 5 MG PO TABS
5.0000 mg | ORAL_TABLET | Freq: Every day | ORAL | 1 refills | Status: DC
Start: 2020-10-03 — End: 2021-01-20

## 2020-10-03 MED ORDER — AMOXICILLIN 500 MG PO CAPS: 500.0000 mg | ORAL_CAPSULE | Freq: Two times a day (BID) | ORAL | 0 refills | Status: DC

## 2020-10-03 MED ORDER — AMLODIPINE BESYLATE 10 MG PO TABS: ORAL_TABLET | ORAL | 1 refills | Status: DC

## 2020-10-03 MED ORDER — HYDROCHLOROTHIAZIDE 25 MG PO TABS: ORAL_TABLET | ORAL | 1 refills | Status: DC

## 2020-10-03 NOTE — Progress Notes (Signed)
Patient ID: Tracy Kelley, female    DOB: [birth year], 49 y.o.   MRN: [id number]   Chief Complaint  Patient presents with  . Annual Exam    Discuss recent labs and probable need to go back on sugar meds   Subjective:    HPI   The patient comes in today for a wellness visit.  A review of their health history was completed.  A review of medications was also completed.  Any needed refills; yes  Eating habits: so so -trying to do better  Falls/  MVA accidents in past few months: none  Regular exercise: what she can  Specialist pt sees on regular basis: no  Preventative health issues were discussed.   Additional concerns: needs to get back on diabetic meds. Went to eye doctor last visit and has changed.  a1c 7.1 on last visit.  Was on glipizide 5mg , taking 1x per day. bg- in 200s, pt is seeing at home this week.   At eye doctor office BG was in 300s.  H/o breast cancer in mother's side.  No first deg relatives.  Lumbar radiculopathy had this on last visit. Seeing ortho and chiropractic and pain in rt leg improved after having 2 cortisone inject and helped.  Trying to do some light exercising and walking. Feeling her weight and bg is elevating.   Medical History Surie has a past medical history of Anxiety, Diabetes mellitus without complication ([place]), Hyperlipidemia, Hypertension, and Thyroid disease.   Outpatient Encounter Medications as of 10/03/2020  Medication Sig  . amLODipine (NORVASC) 10 MG tablet TAKE 1 TABLET(10 MG) BY MOUTH DAILY  . fenofibrate 160 MG tablet TAKE 1 TABLET BY MOUTH EVERY DAY FOR TRIGLYCERIDES  . glipiZIDE (GLUCOTROL) 5 MG tablet Take 1 tablet (5 mg total) by mouth daily.  . hydrochlorothiazide (HYDRODIURIL) 25 MG tablet TAKE 1 TABLET(25 MG) BY MOUTH DAILY  . HYDROcodone-acetaminophen (NORCO/VICODIN) 5-325 MG tablet One tablet every six hours for pain.  Limit 7 days. (Patient not taking: Reported on 10/03/2020)  . Multiple Vitamin  (MULTIVITAMIN WITH MINERALS) TABS tablet Take 1 tablet by mouth daily.  . naproxen (NAPROSYN) 500 MG tablet Take 1 tablet (500 mg total) by mouth 2 (two) times daily with a meal.  . temazepam (RESTORIL) 15 MG capsule TAKE 1 CAPSULE BY MOUTH EVERY DAY AT BEDTIME  . [DISCONTINUED] amLODipine (NORVASC) 10 MG tablet TAKE 1 TABLET(10 MG) BY MOUTH DAILY  . [DISCONTINUED] hydrochlorothiazide (HYDRODIURIL) 25 MG tablet TAKE 1 TABLET(25 MG) BY MOUTH DAILY  . [DISCONTINUED] temazepam (RESTORIL) 15 MG capsule TAKE 1 CAPSULE BY MOUTH EVERY DAY AT BEDTIME   Facility-Administered Encounter Medications as of 10/03/2020  Medication  . betamethasone acetate-betamethasone sodium phosphate (CELESTONE) injection 12 mg     Review of Systems  Constitutional: Negative for chills and fever.  HENT: Positive for sore throat. Negative for congestion and rhinorrhea.   Eyes: Positive for visual disturbance.  Respiratory: Negative for cough, shortness of breath and wheezing.   Cardiovascular: Negative for chest pain and leg swelling.  Gastrointestinal: Negative for abdominal pain, diarrhea, nausea and vomiting.  Genitourinary: Negative for dysuria and frequency.  Musculoskeletal: Negative for arthralgias and back pain.  Skin: Negative for rash.  Neurological: Negative for dizziness, weakness and headaches.  Psychiatric/Behavioral: Positive for sleep disturbance. Negative for dysphoric mood, self-injury and suicidal ideas. The patient is not nervous/anxious.      Vitals BP 120/78   Pulse 98   Temp (!) 97.4 F (36.3 C) (Oral)  Ht 5\' 9"  (1.753 m)   Wt 209 lb 12.8 oz (95.2 kg)   SpO2 99%   BMI 30.98 kg/m   Objective:   Physical Exam Vitals and nursing note reviewed. Exam conducted with a chaperone present.  Constitutional:      General: She is not in acute distress.    Appearance: Normal appearance. She is not ill-appearing.  HENT:     Head: Normocephalic and atraumatic.     Right Ear: Tympanic  membrane, ear canal and external ear normal.     Left Ear: Tympanic membrane, ear canal and external ear normal.     Nose: Nose normal. No congestion or rhinorrhea.     Mouth/Throat:     Mouth: Mucous membranes are moist.     Pharynx: Oropharynx is clear. Posterior oropharyngeal erythema present.  Eyes:     Extraocular Movements: Extraocular movements intact.     Conjunctiva/sclera: Conjunctivae normal.     Pupils: Pupils are equal, round, and reactive to light.  Cardiovascular:     Rate and Rhythm: Normal rate and regular rhythm.     Pulses: Normal pulses.     Heart sounds: Normal heart sounds. No murmur heard.   Pulmonary:     Effort: Pulmonary effort is normal.     Breath sounds: Normal breath sounds. No wheezing, rhonchi or rales.  Chest:     Chest wall: No mass.  Breasts:     Right: Normal. No swelling, bleeding, inverted nipple, mass, nipple discharge, skin change or tenderness.     Left: Normal. No swelling, bleeding, inverted nipple, mass, nipple discharge, skin change or tenderness.    Abdominal:     General: Abdomen is flat. Bowel sounds are normal. There is no distension.     Palpations: Abdomen is soft. There is no mass.     Tenderness: There is no abdominal tenderness. There is no guarding or rebound.     Hernia: No hernia is present.  Genitourinary:    General: Normal vulva.     Exam position: Lithotomy position.     Pubic Area: No rash.      Labia:        Right: No rash.        Left: No rash.      Urethra: No prolapse.     Vagina: No vaginal discharge.     Comments: Thick white discharge.  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 No cmt, no mass.  Musculoskeletal:        General: Normal range of motion.     Cervical back: Normal range of motion.     Right lower leg: No edema.     Left lower leg: No edema.  Skin:    General: Skin is warm and dry.     Findings: No lesion or rash.  Neurological:     General: No focal deficit present.     Mental Status: She is alert  and oriented to person, place, and time.     Cranial Nerves: No cranial nerve deficit.  Psychiatric:        Mood and Affect: Mood normal.        Behavior: Behavior normal.        Thought Content: Thought content normal.        Judgment: Judgment normal.     Assessment and Plan   1. Well woman exam with routine gynecological exam  2. Essential hypertension - amLODipine (NORVASC) 10 MG tablet; TAKE 1 TABLET(10 MG) BY MOUTH DAILY  Dispense: 90 tablet; Refill: 1 - hydrochlorothiazide (HYDRODIURIL) 25 MG tablet; TAKE 1 TABLET(25 MG) BY MOUTH DAILY  Dispense: 90 tablet; Refill: 1  3. Diabetes mellitus type 2 in nonobese (HCC)  4. Insomnia secondary to anxiety  5. Sore throat - POCT rapid strep A  6. Pap smear for cervical cancer screening - IGP, Aptima HPV  7. Strep pharyngitis   DM2- uncontrolled.  Having vision disturbances and changes in vision since seeing her eye doctor. Check bg daily, if seeing numbers over 180s call office sooner.  Will recheck a1c and dm labs on next visit. Will re-start taking glipizde qam with food.   Having insomnia still- wanting to restart the temazepam for sleep.  Lumbar radiculopathy- doing well and improved after injections in her back.  No longer having to take opiates for pain.  Strep pharyngitis- +strep testing on RST- will order amoxicillin 500 mg bid of 7 days.  htn- stable. Cont meds.   F/u 50mo to recheck dm.

## 2020-10-03 NOTE — Telephone Encounter (Signed)
Discussed with pt. Pt verbalized understanding.  °

## 2020-10-03 NOTE — Telephone Encounter (Signed)
And med also sent to pharm.

## 2020-10-07 ENCOUNTER — Other Ambulatory Visit: Payer: Self-pay | Admitting: Family Medicine

## 2020-10-07 LAB — IGP, APTIMA HPV: HPV Aptima: NEGATIVE

## 2020-10-07 MED ORDER — FLUCONAZOLE 150 MG PO TABS: 150.0000 mg | ORAL_TABLET | Freq: Once | ORAL | 0 refills | Status: AC

## 2020-10-15 ENCOUNTER — Telehealth: Payer: Self-pay | Admitting: Orthopaedic Surgery

## 2020-10-15 NOTE — Telephone Encounter (Signed)
Patient called for refill - aware Dr Luna Glasgow is out of clinic until after New Year's day; aware our other providers have left office for today and remainder of week.  HYDROcodone-acetaminophen (NORCO/VICODIN) 5-325 MG tablet 28 tablet  [employer] [facility], [address] [place]

## 2020-10-20 ENCOUNTER — Other Ambulatory Visit: Payer: Self-pay | Admitting: Orthopedic Surgery

## 2020-10-20 MED ORDER — HYDROCODONE-ACETAMINOPHEN 5-325 MG PO TABS: ORAL_TABLET | ORAL | 0 refills | Status: DC

## 2020-10-21 NOTE — Telephone Encounter (Signed)
Patient called to follow up on this request - states medication needed to be pre-authorized. routing to clinic staff for further advice.

## 2020-10-21 NOTE — Telephone Encounter (Addendum)
Dr Hilda Lias does not do prior authorizations for his patients I have called to advise.

## 2020-10-21 NOTE — Telephone Encounter (Signed)
It was sent yesterday

## 2020-11-04 ENCOUNTER — Telehealth: Payer: Self-pay | Admitting: Physical Medicine and Rehabilitation

## 2020-11-04 NOTE — Telephone Encounter (Signed)
Patient called needing an appointment with Dr. Ernestina Patches. The number to contact patient is [phone]

## 2020-11-05 NOTE — Telephone Encounter (Signed)
Last injection helped more than 50%.

## 2020-11-05 NOTE — Telephone Encounter (Signed)
Patient had L4-5 IL on 09/24/20. Ok to repeat if helped, same problem/side, and no new injury?

## 2020-11-05 NOTE — Telephone Encounter (Signed)
Ok but can't do more for a while, if not very helpful could try L4 tf esi Or if mostly back pain then 2 level facet

## 2020-11-06 ENCOUNTER — Telehealth: Payer: Self-pay | Admitting: Orthopaedic Surgery

## 2020-11-06 NOTE — Telephone Encounter (Signed)
HYDROcodone-acetaminophen (NORCO/VICODIN) 5-325 MG tablet 28 tablet  -[employer] [facility], [employer]

## 2020-11-06 NOTE — Telephone Encounter (Signed)
Pt not req Auth# [id number]

## 2020-11-06 NOTE — Telephone Encounter (Signed)
Pt just called asking if we checked on her insurance [facility] ) and pt is wondering if she can get an apt.

## 2020-11-06 NOTE — Telephone Encounter (Signed)
Called pt and sch 2/8

## 2020-11-10 NOTE — Telephone Encounter (Signed)
Denied.   She got Rx on 11-07-20.

## 2020-11-11 ENCOUNTER — Ambulatory Visit: Payer: PRIVATE HEALTH INSURANCE | Admitting: Orthopaedic Surgery

## 2020-11-18 ENCOUNTER — Ambulatory Visit: Payer: PRIVATE HEALTH INSURANCE | Admitting: Orthopaedic Surgery

## 2020-11-19 ENCOUNTER — Other Ambulatory Visit: Payer: Self-pay | Admitting: Family Medicine

## 2020-11-19 DIAGNOSIS — E785 Hyperlipidemia, unspecified: Secondary | ICD-10-CM

## 2020-11-30 NOTE — Procedures (Signed)
Lumbar Epidural Steroid Injection - Interlaminar Approach with Fluoroscopic Guidance  Patient: Tracy Kelley      Date of Birth: 1971/03/28 MRN: [id number] PCP: Erven Colla, DO      Visit Date: 09/24/2020   Universal Protocol:     Consent Given By: the patient  Position: PRONE  Additional Comments: Vital signs were monitored before and after the procedure. Patient was prepped and draped in the usual sterile fashion. The correct patient, procedure, and site was verified.   Injection Procedure Details:   Procedure diagnoses: Lumbar radiculopathy [M54.16]   Meds Administered:  Meds ordered this encounter  Medications  . betamethasone acetate-betamethasone sodium phosphate (CELESTONE) injection 12 mg     Laterality: Right  Location/Site:  L4-L5  Needle: 3.5 in., 20 ga. Tuohy  Needle Placement: Paramedian epidural  Findings:   -Comments: Excellent flow of contrast into the epidural space.  Procedure Details: Using a paramedian approach from the side mentioned above, the region overlying the inferior lamina was localized under fluoroscopic visualization and the soft tissues overlying this structure were infiltrated with 4 ml. of 1% Lidocaine without Epinephrine. The Tuohy needle was inserted into the epidural space using a paramedian approach.   The epidural space was localized using loss of resistance along with counter oblique bi-planar fluoroscopic views.  After negative aspirate for air, blood, and CSF, a 2 ml. volume of Isovue-250 was injected into the epidural space and the flow of contrast was observed. Radiographs were obtained for documentation purposes.    The injectate was administered into the level noted above.   Additional Comments:  The patient tolerated the procedure well Dressing: 2 x 2 sterile gauze and Band-Aid    Post-procedure details: Patient was observed during the procedure. Post-procedure instructions were reviewed.  Patient left the  clinic in stable condition.

## 2020-11-30 NOTE — Progress Notes (Signed)
Tracy Kelley - 50 y.o. female MRN [id number]  Date of birth: 12-10-1970  Office Visit Note: Visit Date: 09/24/2020 PCP: Erven Colla, DO Referred by: Erven Colla, DO  Subjective: Chief Complaint  Patient presents with  . Lower Back - Pain  . Right Hip - Pain   HPI:  Tracy Kelley is a 50 y.o. female who comes in today for planned repeat Right L4-L5 Lumbar epidural steroid injection with fluoroscopic guidance.  The patient has failed conservative care including home exercise, medications, time and activity modification.  This injection will be diagnostic and hopefully therapeutic.  Please see requesting physician notes for further details and justification. Patient received more than 50% pain relief from prior injection. She states that she doesn't feel the pain going all the way down her right leg and it stop at her hip.   Referring: Dr. Sanjuana Kava  Consider facet/medial branch blocks.   ROS Otherwise per HPI.  Assessment & Plan: Visit Diagnoses:    ICD-10-CM   1. Lumbar radiculopathy  M54.16 XR C-ARM NO REPORT    Epidural Steroid injection    betamethasone acetate-betamethasone sodium phosphate (CELESTONE) injection 12 mg    Plan: No additional findings.   Meds & Orders:  Meds ordered this encounter  Medications  . betamethasone acetate-betamethasone sodium phosphate (CELESTONE) injection 12 mg    Orders Placed This Encounter  Procedures  . XR C-ARM NO REPORT  . Epidural Steroid injection    Follow-up: Return for visit to requesting physician as needed.   Procedures: No procedures performed  Lumbar Epidural Steroid Injection - Interlaminar Approach with Fluoroscopic Guidance  Patient: Tracy Kelley      Date of Birth: May 02, 1971 MRN: [id number] PCP: Erven Colla, DO      Visit Date: 09/24/2020   Universal Protocol:     Consent Given By: the patient  Position: PRONE  Additional Comments: Vital signs were monitored before and after the  procedure. Patient was prepped and draped in the usual sterile fashion. The correct patient, procedure, and site was verified.   Injection Procedure Details:   Procedure diagnoses: Lumbar radiculopathy [M54.16]   Meds Administered:  Meds ordered this encounter  Medications  . betamethasone acetate-betamethasone sodium phosphate (CELESTONE) injection 12 mg     Laterality: Right  Location/Site:  L4-L5  Needle: 3.5 in., 20 ga. Tuohy  Needle Placement: Paramedian epidural  Findings:   -Comments: Excellent flow of contrast into the epidural space.  Procedure Details: Using a paramedian approach from the side mentioned above, the region overlying the inferior lamina was localized under fluoroscopic visualization and the soft tissues overlying this structure were infiltrated with 4 ml. of 1% Lidocaine without Epinephrine. The Tuohy needle was inserted into the epidural space using a paramedian approach.   The epidural space was localized using loss of resistance along with counter oblique bi-planar fluoroscopic views.  After negative aspirate for air, blood, and CSF, a 2 ml. volume of Isovue-250 was injected into the epidural space and the flow of contrast was observed. Radiographs were obtained for documentation purposes.    The injectate was administered into the level noted above.   Additional Comments:  The patient tolerated the procedure well Dressing: 2 x 2 sterile gauze and Band-Aid    Post-procedure details: Patient was observed during the procedure. Post-procedure instructions were reviewed.  Patient left the clinic in stable condition.    Clinical History: MRI LUMBAR SPINE WITHOUT CONTRAST  TECHNIQUE: Multiplanar, multisequence MR imaging  of the lumbar spine was performed. No intravenous contrast was administered.  COMPARISON:  None.  FINDINGS: Segmentation:  Standard.  Alignment:  Minimal anterolisthesis of L3 over L4.  Vertebrae: No fracture,  evidence of discitis, or bone lesion. Endplate degenerative changes at L4-5. Large hemangioma within the body and right L3 pedicle.  Conus medullaris and cauda equina: Conus extends to the L1 level. Conus and cauda equina appear normal.  Paraspinal and other soft tissues: Negative.  Disc levels:  T12-L1: No spinal canal or neural foraminal stenosis.  L1-2: No spinal canal or neural foraminal stenosis.  L2-3: Facet degenerative changes. No spinal canal or neural foraminal stenosis.  L3-4: Disc bulge with prominent hypertrophic facet degenerative changes and ligamentum flavum redundancy resulting in mild bilateral neural foraminal narrowing. No significant spinal canal stenosis.  L4-5: Disc bulge, moderate facet degenerative changes and ligamentum flavum redundancy resulting in mild narrowing of the bilateral subarticular zones, mild-to-moderate right and moderate left neural foraminal narrowing.  L5-S1: Mild facet degenerative changes. No spinal canal or neural foraminal stenosis.  IMPRESSION: 1. 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 2. Mild narrowing of the bilateral subarticular zones, mild-to-moderate right and moderate left neural foraminal narrowing at L4-5.   Electronically Signed   By: Pedro Earls M.D.   On: 07/28/2020 18:11     Objective:  VS:  HT:    WT:   BMI:     BP:117/82  HR:96bpm  TEMP: ( )  RESP:  Physical Exam Vitals and nursing note reviewed.  Constitutional:      General: She is not in acute distress.    Appearance: Normal appearance. She is obese. She is not ill-appearing.  HENT:     Head: Normocephalic and atraumatic.     Right Ear: External ear normal.     Left Ear: External ear normal.  Eyes:     Extraocular Movements: Extraocular movements intact.  Cardiovascular:     Rate and Rhythm: Normal rate.     Pulses: Normal pulses.   Pulmonary:     Effort: Pulmonary effort is normal. No respiratory distress.  Abdominal:     General: There is no distension.     Palpations: Abdomen is soft.  Musculoskeletal:        General: Tenderness present.     Cervical back: Neck supple.     Right lower leg: No edema.     Left lower leg: No edema.     Comments: Patient has good distal strength with no pain over the greater trochanters.  No clonus or focal weakness.  Skin:    Findings: No erythema, lesion or rash.  Neurological:     General: No focal deficit present.     Mental Status: She is alert and oriented to person, place, and time.     Sensory: No sensory deficit.     Motor: No weakness or abnormal muscle tone.     Coordination: Coordination normal.  Psychiatric:        Mood and Affect: Mood normal.        Behavior: Behavior normal.      Imaging: No results found.

## 2020-12-02 ENCOUNTER — Ambulatory Visit: Payer: Self-pay

## 2020-12-02 ENCOUNTER — Ambulatory Visit (INDEPENDENT_AMBULATORY_CARE_PROVIDER_SITE_OTHER): Payer: PRIVATE HEALTH INSURANCE | Admitting: Physical Medicine and Rehabilitation

## 2020-12-02 ENCOUNTER — Encounter: Payer: Self-pay | Admitting: Physical Medicine and Rehabilitation

## 2020-12-02 ENCOUNTER — Other Ambulatory Visit: Payer: Self-pay

## 2020-12-02 VITALS — BP 126/87 | HR 96

## 2020-12-02 DIAGNOSIS — M48061 Spinal stenosis, lumbar region without neurogenic claudication: Secondary | ICD-10-CM | POA: Insufficient documentation

## 2020-12-02 DIAGNOSIS — M5416 Radiculopathy, lumbar region: Secondary | ICD-10-CM

## 2020-12-02 MED ORDER — METHYLPREDNISOLONE ACETATE 80 MG/ML IJ SUSP
80.0000 mg | Freq: Once | INTRAMUSCULAR | Status: AC
  Administered 2020-12-02: 80 mg

## 2020-12-02 NOTE — Progress Notes (Signed)
Tracy Kelley - 50 y.o. female MRN [id number]  Date of birth: [birth year]  Office Visit Note: Visit Date: 12/02/2020 PCP: Erven Colla, DO Referred by: Erven Colla, DO  Subjective: Chief Complaint  Patient presents with  . Right Hip - Pain   HPI:  Tracy Kelley is a 50 y.o. female who comes in today at the request of Dr. Sanjuana Kava for planned Right L4-L5 Lumbar epidural steroid injection with fluoroscopic guidance.  The patient has failed conservative care including home exercise, medications, time and activity modification.  This injection will be diagnostic and hopefully therapeutic.  Please see requesting physician notes for further details and justification.  MRI reviewed with images and spine model.  MRI reviewed in the note below.  Prior interlaminar injections have helped short term. Plan is for transforaminal approach for foraminal narrowing. If no long term relief then suggest Orthopedic re-eval, PT/Chiro, medical pain management versus spine surgeon evaluation.   ROS Otherwise per HPI.  Assessment & Plan: Visit Diagnoses:    ICD-10-CM   1. Lumbar radiculopathy  M54.16 XR C-ARM NO REPORT    Epidural Steroid injection    methylPREDNISolone acetate (DEPO-MEDROL) injection 80 mg  2. 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   Meds & Orders:  Meds ordered this encounter  Medications  . methylPREDNISolone acetate (DEPO-MEDROL) injection 80 mg    Orders Placed This Encounter  Procedures  . XR C-ARM NO REPORT  . Epidural Steroid injection    Follow-up: Return for visit to requesting physician as needed.   Procedures: No procedures performed  Lumbosacral Transforaminal Epidural Steroid Injection - Sub-Pedicular Approach with Fluoroscopic Guidance  Patient: Tracy Kelley      Date of Birth: Oct 21, 1971 MRN: [id number] PCP: Erven Colla, DO      Visit Date: 12/02/2020   Universal Protocol:    Date/Time:  12/02/2020  Consent Given By: the patient  Position: PRONE  Additional Comments: Vital signs were monitored before and after the procedure. Patient was prepped and draped in the usual sterile fashion. The correct patient, procedure, and site was verified.   Injection Procedure Details:   Procedure diagnoses: Lumbar radiculopathy [M54.16]    Meds Administered:  Meds ordered this encounter  Medications  . methylPREDNISolone acetate (DEPO-MEDROL) injection 80 mg    Laterality: Right  Location/Site:  L4-L5  Needle:5.0 in., 22 ga.  Short bevel or Quincke spinal needle  Needle Placement: Transforaminal  Findings:    -Comments: Excellent flow of contrast along the nerve, nerve root and into the epidural space.  Procedure Details: After squaring off the end-plates to get a true AP view, the C-arm was positioned so that an oblique view of the foramen as noted above was visualized. The target area is just inferior to the "nose of the scotty dog" or sub pedicular. The soft tissues overlying this structure were infiltrated with 2-3 ml. of 1% Lidocaine without Epinephrine.  The spinal needle was inserted toward the target using a "trajectory" view along the fluoroscope beam.  Under AP and lateral visualization, the needle was advanced so it did not puncture dura and was located close the 6 O'Clock position of the pedical in AP tracterory. Biplanar projections were used to confirm position. Aspiration was confirmed to be negative for CSF and/or blood. A 1-2 ml. volume of Isovue-250 was injected and flow of contrast was noted at each level. Radiographs were obtained for documentation purposes.  After attaining the desired flow of contrast documented above, a 0.5 to 1.0 ml test dose of 0.25% Marcaine was injected into each respective transforaminal space.  The patient was observed for 90 seconds post injection.  After no sensory deficits were reported, and normal lower extremity motor  function was noted,   the above injectate was administered so that equal amounts of the injectate were placed at each foramen (level) into the transforaminal epidural space.   Additional Comments:  The patient tolerated the procedure well Dressing: 2 x 2 sterile gauze and Band-Aid    Post-procedure details: Patient was observed during the procedure. Post-procedure instructions were reviewed.  Patient left the clinic in stable condition.      Clinical History: MRI LUMBAR SPINE WITHOUT CONTRAST  TECHNIQUE: Multiplanar, multisequence MR imaging of the lumbar spine was performed. No intravenous contrast was administered.  COMPARISON:  None.  FINDINGS: Segmentation:  Standard.  Alignment:  Minimal anterolisthesis of L3 over L4.  Vertebrae: No fracture, evidence of discitis, or bone lesion. Endplate degenerative changes at L4-5. Large hemangioma within the body and right L3 pedicle.  Conus medullaris and cauda equina: Conus extends to the L1 level. Conus and cauda equina appear normal.  Paraspinal and other soft tissues: Negative.  Disc levels:  T12-L1: No spinal canal or neural foraminal stenosis.  L1-2: No spinal canal or neural foraminal stenosis.  L2-3: Facet degenerative changes. No spinal canal or neural foraminal stenosis.  L3-4: Disc bulge with prominent hypertrophic facet degenerative changes and ligamentum flavum redundancy resulting in mild bilateral neural foraminal narrowing. No significant spinal canal stenosis.  L4-5: Disc bulge, moderate facet degenerative changes and ligamentum flavum redundancy resulting in mild narrowing of the bilateral subarticular zones, mild-to-moderate right and moderate left neural foraminal narrowing.  L5-S1: Mild facet degenerative changes. No spinal canal or neural foraminal stenosis.  IMPRESSION: 1. Mild to moderate degenerative changes of the lumbar spine, more pronounced at the level of the facet  joints at L3-4. 2. Mild narrowing of the bilateral subarticular zones, mild-to-moderate right and moderate left neural foraminal narrowing at L4-5.   Electronically Signed   By: Pedro Earls M.D.   On: 07/28/2020 18:11     Objective:  VS:  HT:    WT:   BMI:     BP:126/87  HR:96bpm  TEMP: ( )  RESP:  Physical Exam Vitals and nursing note reviewed.  Constitutional:      General: She is not in acute distress.    Appearance: Normal appearance. She is obese. She is not ill-appearing.  HENT:     Head: Normocephalic and atraumatic.     Right Ear: External ear normal.     Left Ear: External ear normal.  Eyes:     Extraocular Movements: Extraocular movements intact.  Cardiovascular:     Rate and Rhythm: Normal rate.     Pulses: Normal pulses.  Pulmonary:     Effort: Pulmonary effort is normal. No respiratory distress.  Abdominal:     General: There is no distension.     Palpations: Abdomen is soft.  Musculoskeletal:        General: Tenderness present.     Cervical back: Neck supple.     Right lower leg: No edema.     Left lower leg: No edema.     Comments: Patient has good distal strength with no pain over the greater trochanters.  No clonus or focal weakness.  Skin:    Findings: No erythema, lesion  or rash.  Neurological:     General: No focal deficit present.     Mental Status: She is alert and oriented to person, place, and time.     Sensory: No sensory deficit.     Motor: No weakness or abnormal muscle tone.     Coordination: Coordination normal.  Psychiatric:        Mood and Affect: Mood normal.        Behavior: Behavior normal.      Imaging: No results found.

## 2020-12-02 NOTE — Procedures (Signed)
Lumbosacral Transforaminal Epidural Steroid Injection - Sub-Pedicular Approach with Fluoroscopic Guidance  Patient: Tracy Kelley      Date of Birth: Mar 05, 1971 MRN: [id number] PCP: Erven Colla, DO      Visit Date: 12/02/2020   Universal Protocol:    Date/Time: 12/02/2020  Consent Given By: the patient  Position: PRONE  Additional Comments: Vital signs were monitored before and after the procedure. Patient was prepped and draped in the usual sterile fashion. The correct patient, procedure, and site was verified.   Injection Procedure Details:   Procedure diagnoses: Lumbar radiculopathy [M54.16]    Meds Administered:  Meds ordered this encounter  Medications  . methylPREDNISolone acetate (DEPO-MEDROL) injection 80 mg    Laterality: Right  Location/Site:  L4-L5  Needle:5.0 in., 22 ga.  Short bevel or Quincke spinal needle  Needle Placement: Transforaminal  Findings:    -Comments: Excellent flow of contrast along the nerve, nerve root and into the epidural space.  Procedure Details: After squaring off the end-plates to get a true AP view, the C-arm was positioned so that an oblique view of the foramen as noted above was visualized. The target area is just inferior to the "nose of the scotty dog" or sub pedicular. The soft tissues overlying this structure were infiltrated with 2-3 ml. of 1% Lidocaine without Epinephrine.  The spinal needle was inserted toward the target using a "trajectory" view along the fluoroscope beam.  Under AP and lateral visualization, the needle was advanced so it did not puncture dura and was located close the 6 O'Clock position of the pedical in AP tracterory. Biplanar projections were used to confirm position. Aspiration was confirmed to be negative for CSF and/or blood. A 1-2 ml. volume of Isovue-250 was injected and flow of contrast was noted at each level. Radiographs were obtained for documentation purposes.   After attaining the desired  flow of contrast documented above, a 0.5 to 1.0 ml test dose of 0.25% Marcaine was injected into each respective transforaminal space.  The patient was observed for 90 seconds post injection.  After no sensory deficits were reported, and normal lower extremity motor function was noted,   the above injectate was administered so that equal amounts of the injectate were placed at each foramen (level) into the transforaminal epidural space.   Additional Comments:  The patient tolerated the procedure well Dressing: 2 x 2 sterile gauze and Band-Aid    Post-procedure details: Patient was observed during the procedure. Post-procedure instructions were reviewed.  Patient left the clinic in stable condition.

## 2020-12-02 NOTE — Patient Instructions (Signed)

## 2020-12-02 NOTE — Progress Notes (Signed)
Pt state right hip pain. Pt state sitting for a long time makes the pain worse. Pt state she take pain meds and heating pads to help ease the pain. Pt has hx of inj on 09/24/20 pt state it helped for a two weeks.  Numeric Pain Rating Scale and Functional Assessment Average Pain 6   In the last MONTH (on 0-10 scale) has pain interfered with the following?  1. General activity like being  able to carry out your everyday physical activities such as walking, climbing stairs, carrying groceries, or moving a chair?  Rating(8)   +Driver, -BT, -Dye Allergies.

## 2020-12-03 ENCOUNTER — Telehealth: Payer: Self-pay

## 2020-12-03 DIAGNOSIS — E559 Vitamin D deficiency, unspecified: Secondary | ICD-10-CM

## 2020-12-03 DIAGNOSIS — I10 Essential (primary) hypertension: Secondary | ICD-10-CM

## 2020-12-03 DIAGNOSIS — E119 Type 2 diabetes mellitus without complications: Secondary | ICD-10-CM

## 2020-12-03 DIAGNOSIS — D72829 Elevated white blood cell count, unspecified: Secondary | ICD-10-CM

## 2020-12-03 DIAGNOSIS — E785 Hyperlipidemia, unspecified: Secondary | ICD-10-CM

## 2020-12-03 NOTE — Telephone Encounter (Signed)
Follow up made on March 10 for sugar will patient need blood work done   Pt call back [phone]

## 2020-12-03 NOTE — Telephone Encounter (Signed)
Last labs 07/24/20. Tsh, vit d, microalb urine, cmp, cbc, lipid

## 2020-12-03 NOTE — Telephone Encounter (Signed)
Yes pls order those and a1c. Thx. D.r Courtland Reas

## 2020-12-04 ENCOUNTER — Ambulatory Visit (INDEPENDENT_AMBULATORY_CARE_PROVIDER_SITE_OTHER): Payer: PRIVATE HEALTH INSURANCE | Admitting: Orthopaedic Surgery

## 2020-12-04 ENCOUNTER — Encounter: Payer: Self-pay | Admitting: Orthopaedic Surgery

## 2020-12-04 ENCOUNTER — Other Ambulatory Visit: Payer: Self-pay

## 2020-12-04 VITALS — BP 135/84 | HR 96 | Ht 69.0 in | Wt 214.0 lb

## 2020-12-04 DIAGNOSIS — M545 Low back pain, unspecified: Secondary | ICD-10-CM

## 2020-12-04 DIAGNOSIS — M79604 Pain in right leg: Secondary | ICD-10-CM

## 2020-12-04 MED ORDER — HYDROCODONE-ACETAMINOPHEN 5-325 MG PO TABS: ORAL_TABLET | ORAL | 0 refills | Status: DC

## 2020-12-04 NOTE — Telephone Encounter (Signed)
Patient notified

## 2020-12-04 NOTE — Telephone Encounter (Signed)
Bw orders put in and left message to return call to notify pt.
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
## 2021-01-01 ENCOUNTER — Ambulatory Visit: Payer: PRIVATE HEALTH INSURANCE | Admitting: Family Medicine

## 2021-01-02 LAB — HM DIABETES EYE EXAM

## 2021-01-12 ENCOUNTER — Telehealth: Payer: Self-pay | Admitting: Orthopaedic Surgery

## 2021-01-12 NOTE — Telephone Encounter (Signed)
Patient request refill for pain medicine   HYDROcodone-acetaminophen (NORCO/VICODIN) 5-325 MG tablet   Pharmacy  [facility] on [address]

## 2021-01-13 MED ORDER — HYDROCODONE-ACETAMINOPHEN 5-325 MG PO TABS
ORAL_TABLET | ORAL | 0 refills | Status: DC
Start: 2021-01-13 — End: 2021-02-24

## 2021-01-15 LAB — COMPREHENSIVE METABOLIC PANEL
ALT: 12 IU/L (ref 0–32)
AST: 20 IU/L (ref 0–40)
Albumin/Globulin Ratio: 1.5 (ref 1.2–2.2)
Albumin: 4.4 g/dL (ref 3.8–4.8)
Alkaline Phosphatase: 72 IU/L (ref 44–121)
BUN/Creatinine Ratio: 9 (ref 9–23)
BUN: 8 mg/dL (ref 6–24)
Bilirubin Total: <0.2 mg/dL (ref 0.0–1.2)
CO2: 23 mmol/L (ref 20–29)
Calcium: 9.8 mg/dL (ref 8.7–10.2)
Chloride: 96 mmol/L (ref 96–106)
Creatinine, Ser: 0.87 mg/dL (ref 0.57–1.00)
Globulin, Total: 3 g/dL (ref 1.5–4.5)
Glucose: 131 mg/dL — ABNORMAL HIGH (ref 65–99)
Potassium: 4 mmol/L (ref 3.5–5.2)
Sodium: 140 mmol/L (ref 134–144)
Total Protein: 7.4 g/dL (ref 6.0–8.5)
eGFR: 82 mL/min/{1.73_m2} (ref >59)

## 2021-01-15 LAB — CBC WITH DIFFERENTIAL/PLATELET
Basophils Absolute: 0 10*3/uL (ref 0.0–0.2)
Basos: 0 %
EOS (ABSOLUTE): 0.1 10*3/uL (ref 0.0–0.4)
Eos: 2 %
Hematocrit: 37.4 % (ref 34.0–46.6)
Hemoglobin: 12.6 g/dL (ref 11.1–15.9)
Immature Grans (Abs): 0 10*3/uL (ref 0.0–0.1)
Immature Granulocytes: 0 %
Lymphocytes Absolute: 2.1 10*3/uL (ref 0.7–3.1)
Lymphs: 31 %
MCH: 27.8 pg (ref 26.6–33.0)
MCHC: 33.7 g/dL (ref 31.5–35.7)
MCV: 82 fL (ref 79–97)
Monocytes Absolute: 0.6 10*3/uL (ref 0.1–0.9)
Monocytes: 8 %
Neutrophils Absolute: 3.9 10*3/uL (ref 1.4–7.0)
Neutrophils: 59 %
Platelets: 372 10*3/uL (ref 150–450)
RBC: 4.54 x10E6/uL (ref 3.77–5.28)
RDW: 14.6 % (ref 11.7–15.4)
WBC: 6.7 10*3/uL (ref 3.4–10.8)

## 2021-01-15 LAB — MICROALBUMIN / CREATININE URINE RATIO
Creatinine, Urine: 194.5 mg/dL
Microalb/Creat Ratio: 6 mg/g creat (ref 0–29)
Microalbumin, Urine: 12 ug/mL

## 2021-01-15 LAB — LIPID PANEL
Chol/HDL Ratio: 5.1 ratio — ABNORMAL HIGH (ref 0.0–4.4)
Cholesterol, Total: 189 mg/dL (ref 100–199)
HDL: 37 mg/dL — ABNORMAL LOW (ref >39)
LDL Chol Calc (NIH): 96 mg/dL (ref 0–99)
Triglycerides: 337 mg/dL — ABNORMAL HIGH (ref 0–149)
VLDL Cholesterol Cal: 56 mg/dL — ABNORMAL HIGH (ref 5–40)

## 2021-01-15 LAB — VITAMIN D 25 HYDROXY (VIT D DEFICIENCY, FRACTURES): Vit D, 25-Hydroxy: 13.6 ng/mL — ABNORMAL LOW (ref 30.0–100.0)

## 2021-01-15 LAB — TSH: TSH: 1.86 u[IU]/mL (ref 0.450–4.500)

## 2021-01-15 LAB — HEMOGLOBIN A1C
Est. average glucose Bld gHb Est-mCnc: 146 mg/dL
Hgb A1c MFr Bld: 6.7 % — ABNORMAL HIGH (ref 4.8–5.6)

## 2021-01-20 ENCOUNTER — Other Ambulatory Visit: Payer: Self-pay

## 2021-01-20 ENCOUNTER — Ambulatory Visit: Payer: PRIVATE HEALTH INSURANCE | Admitting: Family Medicine

## 2021-01-20 ENCOUNTER — Encounter: Payer: Self-pay | Admitting: Family Medicine

## 2021-01-20 ENCOUNTER — Ambulatory Visit (INDEPENDENT_AMBULATORY_CARE_PROVIDER_SITE_OTHER): Payer: PRIVATE HEALTH INSURANCE | Admitting: Family Medicine

## 2021-01-20 VITALS — BP 128/82 | HR 100 | Temp 97.0°F | Ht 69.0 in | Wt 219.8 lb

## 2021-01-20 DIAGNOSIS — E785 Hyperlipidemia, unspecified: Secondary | ICD-10-CM | POA: Diagnosis not present

## 2021-01-20 DIAGNOSIS — E119 Type 2 diabetes mellitus without complications: Secondary | ICD-10-CM | POA: Diagnosis not present

## 2021-01-20 DIAGNOSIS — I10 Essential (primary) hypertension: Secondary | ICD-10-CM | POA: Diagnosis not present

## 2021-01-20 DIAGNOSIS — E559 Vitamin D deficiency, unspecified: Secondary | ICD-10-CM | POA: Diagnosis not present

## 2021-01-20 MED ORDER — TEMAZEPAM 15 MG PO CAPS: ORAL_CAPSULE | ORAL | 3 refills | Status: DC

## 2021-01-20 MED ORDER — HYDROCHLOROTHIAZIDE 25 MG PO TABS: ORAL_TABLET | ORAL | 1 refills | Status: DC

## 2021-01-20 MED ORDER — FENOFIBRATE 160 MG PO TABS
ORAL_TABLET | ORAL | 1 refills | Status: DC
Start: 2021-01-20 — End: 2021-07-08

## 2021-01-20 MED ORDER — AMLODIPINE BESYLATE 10 MG PO TABS: ORAL_TABLET | ORAL | 1 refills | Status: DC

## 2021-01-20 MED ORDER — CHOLECALCIFEROL 1.25 MG (50000 UT) PO CAPS: 50000.0000 [IU] | ORAL_CAPSULE | ORAL | 0 refills | Status: DC

## 2021-01-20 MED ORDER — GLIPIZIDE 5 MG PO TABS: 5.0000 mg | ORAL_TABLET | Freq: Every day | ORAL | 1 refills | Status: DC

## 2021-01-20 NOTE — Progress Notes (Signed)
Patient ID: Tracy Kelley, female    DOB: August 08, 1971, 50 y.o.   MRN: [id number]   Chief Complaint  Patient presents with  . Diabetes  . Results  . Hypertension   Subjective:    HPI  CC-follow up on blood work, f/u dm2, htn, hld.  -glucose 131. tg- 337 Vit d 13.6 a1c 6.7 tsh 1.86  Has nerve pain in rt lower leg- seeing Dr. Luna Glasgow, had 3rd injection in back in 12/21. Helping with the pain.  Temazepam taking it most of the time.  Not taking nightly only getting 20 tab per month.  No foot issues.  Has had eye exam in past year for diabetes check.  Trying to do exercising bike some to help with her exercising.  Hurting more if standing or walking a lot.  Medical History Psalm has a past medical history of Anxiety, Diabetes mellitus without complication ([place]), Hyperlipidemia, Hypertension, and Thyroid disease.   Outpatient Encounter Medications as of 01/20/2021  Medication Sig  . HYDROcodone-acetaminophen (NORCO/VICODIN) 5-325 MG tablet One tablet every six hours for pain.  Limit 7 days.  . Multiple Vitamin (MULTIVITAMIN WITH MINERALS) TABS tablet Take 1 tablet by mouth daily.  . naproxen (NAPROSYN) 500 MG tablet Take 1 tablet (500 mg total) by mouth 2 (two) times daily with a meal.  . [DISCONTINUED] amLODipine (NORVASC) 10 MG tablet TAKE 1 TABLET(10 MG) BY MOUTH DAILY  . [DISCONTINUED] Cholecalciferol 1.25 MG (50000 UT) capsule Take 1 capsule (50,000 Units total) by mouth once a week.  . [DISCONTINUED] fenofibrate 160 MG tablet TAKE 1 TABLET BY MOUTH DAILY  . [DISCONTINUED] glipiZIDE (GLUCOTROL) 5 MG tablet Take 1 tablet (5 mg total) by mouth daily.  . [DISCONTINUED] hydrochlorothiazide (HYDRODIURIL) 25 MG tablet TAKE 1 TABLET(25 MG) BY MOUTH DAILY  . [DISCONTINUED] temazepam (RESTORIL) 15 MG capsule TAKE 1 CAPSULE BY MOUTH EVERY DAY AT BEDTIME  . amLODipine (NORVASC) 10 MG tablet TAKE 1 TABLET(10 MG) BY MOUTH DAILY  . Cholecalciferol 1.25 MG (50000 UT) capsule Take 1  capsule (50,000 Units total) by mouth once a week.  . fenofibrate 160 MG tablet TAKE 1 TABLET BY MOUTH DAILY  . glipiZIDE (GLUCOTROL) 5 MG tablet Take 1 tablet (5 mg total) by mouth daily.  . hydrochlorothiazide (HYDRODIURIL) 25 MG tablet TAKE 1 TABLET(25 MG) BY MOUTH DAILY  . temazepam (RESTORIL) 15 MG capsule TAKE 1 CAPSULE BY MOUTH EVERY DAY AT BEDTIME  . [DISCONTINUED] amoxicillin (AMOXIL) 500 MG capsule Take 1 capsule (500 mg total) by mouth 2 (two) times daily.   No facility-administered encounter medications on file as of 01/20/2021.     Review of Systems  Constitutional: Negative for chills and fever.  HENT: Negative for congestion, rhinorrhea and sore throat.   Respiratory: Negative for cough, shortness of breath and wheezing.   Cardiovascular: Negative for chest pain and leg swelling.  Gastrointestinal: Negative for abdominal pain, diarrhea, nausea and vomiting.  Genitourinary: Negative for dysuria and frequency.  Musculoskeletal: Negative for arthralgias and back pain.  Skin: Negative for rash.  Neurological: Negative for dizziness, weakness and headaches.     Vitals BP 128/82   Pulse 100   Temp (!) 97 F (36.1 C)   Ht 5\' 9"  (1.753 m)   Wt 219 lb 12.8 oz (99.7 kg)   SpO2 99%   BMI 32.46 kg/m   Objective:   Physical Exam Vitals and nursing note reviewed.  Constitutional:      Appearance: Normal appearance.  HENT:  Head: Normocephalic and atraumatic.     Nose: Nose normal.     Mouth/Throat:     Mouth: Mucous membranes are moist.     Pharynx: Oropharynx is clear.  Eyes:     Extraocular Movements: Extraocular movements intact.     Conjunctiva/sclera: Conjunctivae normal.     Pupils: Pupils are equal, round, and reactive to light.  Cardiovascular:     Rate and Rhythm: Normal rate and regular rhythm.     Pulses: Normal pulses.     Heart sounds: Normal heart sounds.  Pulmonary:     Effort: Pulmonary effort is normal.     Breath sounds: Normal breath  sounds. No wheezing, rhonchi or rales.  Musculoskeletal:        General: Normal range of motion.     Right lower leg: No edema.     Left lower leg: No edema.  Skin:    General: Skin is warm and dry.     Findings: No lesion or rash.  Neurological:     General: No focal deficit present.     Mental Status: She is alert and oriented to person, place, and time.  Psychiatric:        Mood and Affect: Mood normal.        Behavior: Behavior normal.      Assessment and Plan   1. Controlled type 2 diabetes mellitus without complication, without long-term current use of insulin ([place])  2. Essential hypertension - amLODipine (NORVASC) 10 MG tablet; TAKE 1 TABLET(10 MG) BY MOUTH DAILY  Dispense: 90 tablet; Refill: 1 - hydrochlorothiazide (HYDRODIURIL) 25 MG tablet; TAKE 1 TABLET(25 MG) BY MOUTH DAILY  Dispense: 90 tablet; Refill: 1  3. Hyperlipidemia, unspecified hyperlipidemia type - fenofibrate 160 MG tablet; TAKE 1 TABLET BY MOUTH DAILY  Dispense: 90 tablet; Refill: 1  4. Vitamin D deficiency   Vit d def- low, pt to take once weekly dose of vit d.  Recheck vit d, and labs on next visit.  htn- stable. Cont meds.  hld- stable. Cont meds.  dm2- stable. a1c at goal at 6.7.  Cont meds.  Insomnia- stable. Taking temazepam prn.  Return in about 6 months (around 07/23/2021) for f/u dm2, insomnia, vit d def.  01/20/2021

## 2021-01-27 ENCOUNTER — Telehealth: Payer: Self-pay

## 2021-01-27 NOTE — Telephone Encounter (Signed)
Pt needs refill on Lancets needle  [place]   Pt call back [phone]

## 2021-01-28 MED ORDER — LANCETS 30G MISC: 0 refills | Status: AC

## 2021-01-28 NOTE — Telephone Encounter (Signed)
Prescription sent electronically to pharmacy. Patient notified. 

## 2021-02-20 ENCOUNTER — Telehealth: Payer: Self-pay | Admitting: Orthopaedic Surgery

## 2021-02-24 MED ORDER — HYDROCODONE-ACETAMINOPHEN 5-325 MG PO TABS: ORAL_TABLET | ORAL | 0 refills | Status: DC

## 2021-03-05 ENCOUNTER — Other Ambulatory Visit: Payer: Self-pay

## 2021-03-05 ENCOUNTER — Ambulatory Visit (INDEPENDENT_AMBULATORY_CARE_PROVIDER_SITE_OTHER): Payer: PRIVATE HEALTH INSURANCE | Admitting: Orthopaedic Surgery

## 2021-03-05 ENCOUNTER — Encounter: Payer: Self-pay | Admitting: Orthopaedic Surgery

## 2021-03-05 VITALS — BP 135/84 | HR 89 | Ht 69.0 in | Wt 220.2 lb

## 2021-03-05 DIAGNOSIS — M79604 Pain in right leg: Secondary | ICD-10-CM

## 2021-03-05 DIAGNOSIS — M545 Low back pain, unspecified: Secondary | ICD-10-CM

## 2021-03-05 MED ORDER — DICLOFENAC SODIUM 75 MG PO TBEC: 75.0000 mg | DELAYED_RELEASE_TABLET | Freq: Two times a day (BID) | ORAL | 2 refills | Status: DC

## 2021-03-05 MED ORDER — HYDROCODONE-ACETAMINOPHEN 5-325 MG PO TABS: ORAL_TABLET | ORAL | 0 refills | Status: DC

## 2021-03-05 NOTE — Addendum Note (Signed)
Addended by: Willette Pa on: 03/05/2021 08:29 AM   Modules accepted: Orders
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
## 2021-04-07 ENCOUNTER — Telehealth: Payer: Self-pay | Admitting: Orthopaedic Surgery

## 2021-04-07 MED ORDER — HYDROCODONE-ACETAMINOPHEN 5-325 MG PO TABS: ORAL_TABLET | ORAL | 0 refills | Status: DC

## 2021-04-07 NOTE — Telephone Encounter (Signed)
    Patient requests Hydrocodone/Acetaminophen 5-325 mgs.  Qty  28   Sig: One tablet every six hours for pain.  Limit 7 days.   Patient states she uses [facility] on [address].

## 2021-04-16 ENCOUNTER — Ambulatory Visit (INDEPENDENT_AMBULATORY_CARE_PROVIDER_SITE_OTHER): Payer: PRIVATE HEALTH INSURANCE | Admitting: Orthopaedic Surgery

## 2021-04-16 ENCOUNTER — Encounter: Payer: Self-pay | Admitting: Orthopaedic Surgery

## 2021-04-16 ENCOUNTER — Other Ambulatory Visit: Payer: Self-pay

## 2021-04-16 ENCOUNTER — Telehealth: Payer: Self-pay | Admitting: Physical Medicine and Rehabilitation

## 2021-04-16 VITALS — BP 142/94 | HR 96 | Ht 69.0 in | Wt 221.8 lb

## 2021-04-16 DIAGNOSIS — M545 Low back pain, unspecified: Secondary | ICD-10-CM | POA: Diagnosis not present

## 2021-04-16 DIAGNOSIS — M79604 Pain in right leg: Secondary | ICD-10-CM

## 2021-04-16 MED ORDER — PREDNISONE 10 MG (21) PO TBPK: ORAL_TABLET | ORAL | 1 refills | Status: DC

## 2021-04-16 NOTE — Telephone Encounter (Signed)
Patient called. She would like an appointment with Dr. Newton.  

## 2021-04-16 NOTE — Telephone Encounter (Signed)
Right L4 TF on 12/02/20. Ok to repeat if helped, same problem/side, and no new injury?

## 2021-04-16 NOTE — Progress Notes (Signed)
My back is worse.  She has more pain in the lower back with right sided paresthesias being worse over the last month.  She has more pain at the end of the day.  She is taking the diclofenac which is not helping that much. She has no new trauma.  She has no weakness. She is doing her exercises.  She had last epidural in February.  She will call Dr. Romona Curls office to see if she is eligible to have epidural soon.  Spine/Pelvis examination:  Inspection:  Overall, sacoiliac joint benign and hips nontender; without crepitus or defects.   Thoracic spine inspection: Alignment normal without kyphosis present   Lumbar spine inspection:  Alignment  with normal lumbar lordosis, without scoliosis apparent.   Thoracic spine palpation:  without tenderness of spinal processes   Lumbar spine palpation: without tenderness of lumbar area; without tightness of lumbar muscles    Range of Motion:   Lumbar flexion, forward flexion is normal without pain or tenderness    Lumbar extension is full without pain or tenderness   Left lateral bend is normal without pain or tenderness   Right lateral bend is normal without pain or tenderness   Straight leg raising is normal  Strength & tone: normal   Stability overall normal stability  Encounter Diagnosis  Name Primary?   Lumbar pain with radiation down right leg Yes   I will give prednisone dose pack.  Stop the diclofenac when on the dose pack.  Her pain medicine is due Monday.  She will get it then.  Return in one month.  Call if any problem.  Precautions discussed.  Electronically Signed Sanjuana Kava, MD 6/23/20228:33 AM

## 2021-04-17 NOTE — Telephone Encounter (Signed)
Not req Auth# 

## 2021-04-17 NOTE — Telephone Encounter (Signed)
Is auth needed for Right L4 TF? Scheduled for 7/13 with driver.

## 2021-04-28 ENCOUNTER — Other Ambulatory Visit: Payer: Self-pay | Admitting: Family Medicine

## 2021-04-28 NOTE — Telephone Encounter (Signed)
Needs appt to f/u for insomnia and temazepam next 20-30 days Thx. Dr. Lovena Le

## 2021-04-29 ENCOUNTER — Other Ambulatory Visit: Payer: Self-pay | Admitting: Orthopaedic Surgery

## 2021-04-29 MED ORDER — HYDROCODONE-ACETAMINOPHEN 5-325 MG PO TABS: ORAL_TABLET | ORAL | 0 refills | Status: DC

## 2021-04-29 NOTE — Telephone Encounter (Signed)
Patient of Dr Luna Glasgow, aware Dr Luna Glasgow is out of clinic, requests refill: HYDROcodone-acetaminophen (NORCO/VICODIN) 5-325 MG tablet 20 tablet   [facility], [place], [place] location

## 2021-04-30 NOTE — Telephone Encounter (Signed)
Sent mychart message

## 2021-05-06 ENCOUNTER — Other Ambulatory Visit: Payer: Self-pay

## 2021-05-06 ENCOUNTER — Ambulatory Visit (INDEPENDENT_AMBULATORY_CARE_PROVIDER_SITE_OTHER): Payer: No Typology Code available for payment source | Admitting: Physical Medicine and Rehabilitation

## 2021-05-06 ENCOUNTER — Ambulatory Visit: Payer: Self-pay

## 2021-05-06 ENCOUNTER — Encounter: Payer: Self-pay | Admitting: Physical Medicine and Rehabilitation

## 2021-05-06 VITALS — BP 129/87 | HR 103

## 2021-05-06 DIAGNOSIS — M5416 Radiculopathy, lumbar region: Secondary | ICD-10-CM

## 2021-05-06 DIAGNOSIS — M48061 Spinal stenosis, lumbar region without neurogenic claudication: Secondary | ICD-10-CM

## 2021-05-06 MED ORDER — METHYLPREDNISOLONE ACETATE 80 MG/ML IJ SUSP
80.0000 mg | Freq: Once | INTRAMUSCULAR | Status: AC
  Administered 2021-05-06: 80 mg

## 2021-05-06 NOTE — Progress Notes (Signed)
Tracy Kelley - 50 y.o. female MRN [id number]  Date of birth: [birth year]  Office Visit Note: Visit Date: 05/06/2021 PCP: Erven Colla, DO Referred by: Erven Colla, DO  Subjective: Chief Complaint  Patient presents with   Lower Back - Pain   Right Leg - Pain   HPI:  Tracy Kelley is a 50 y.o. female who comes in today for planned repeat Right L4-L5  Lumbar Transforaminal epidural steroid injection with fluoroscopic guidance.  The patient has failed conservative care including home exercise, medications, time and activity modification.  This injection will be diagnostic and hopefully therapeutic.  Please see requesting physician notes for further details and justification. Prior injection did offer more than 50% relief as noted above and lasted more than 3 months and did give the patient more functional ability for activities of daily living and was also beneficial in that it did reduce their medication requirement.  They have had physical therapy and continue with directed home exercise program.  Current medication management is not beneficial in increasing their functional status.  Please note that procedures are done as part of a comprehensive orthopedic and pain management program with access to in-house orthopedics, spine surgery and physical therapy as well as access to [place] biopsychosocial counseling if needed.    Referring: Dr. Sanjuana Kava   ROS Otherwise per HPI.  Assessment & Plan: Visit Diagnoses:    ICD-10-CM   1. Lumbar radiculopathy  M54.16 XR C-ARM NO REPORT    Epidural Steroid injection    methylPREDNISolone acetate (DEPO-MEDROL) injection 80 mg    2. Foraminal stenosis of lumbar region  M48.061 XR C-ARM NO REPORT    Epidural Steroid injection    methylPREDNISolone acetate (DEPO-MEDROL) injection 80 mg      Plan: No additional findings.   Meds & Orders:  Meds ordered this encounter  Medications   methylPREDNISolone acetate  (DEPO-MEDROL) injection 80 mg    Orders Placed This Encounter  Procedures   XR C-ARM NO REPORT   Epidural Steroid injection    Follow-up: No follow-ups on file.   Procedures: No procedures performed  Lumbosacral Transforaminal Epidural Steroid Injection - Sub-Pedicular Approach with Fluoroscopic Guidance  Patient: Tracy Kelley      Date of Birth: 1971-02-17 MRN: [id number] PCP: Erven Colla, DO      Visit Date: 05/06/2021   Universal Protocol:    Date/Time: 05/06/2021  Consent Given By: the patient  Position: PRONE  Additional Comments: Vital signs were monitored before and after the procedure. Patient was prepped and draped in the usual sterile fashion. The correct patient, procedure, and site was verified.   Injection Procedure Details:   Procedure diagnoses: Lumbar radiculopathy [M54.16]    Meds Administered:  Meds ordered this encounter  Medications   methylPREDNISolone acetate (DEPO-MEDROL) injection 80 mg    Laterality: Right  Location/Site:  L4-L5  Needle:5.0 in., 22 ga.  Short bevel or Quincke spinal needle  Needle Placement: Transforaminal  Findings:    -Comments: Excellent flow of contrast along the nerve, nerve root and into the epidural space.  Procedure Details: After squaring off the end-plates to get a true AP view, the C-arm was positioned so that an oblique view of the foramen as noted above was visualized. The target area is just inferior to the "nose of the scotty dog" or sub pedicular. The soft tissues overlying this structure were infiltrated with 2-3 ml. of 1% Lidocaine without Epinephrine.  The spinal  needle was inserted toward the target using a "trajectory" view along the fluoroscope beam.  Under AP and lateral visualization, the needle was advanced so it did not puncture dura and was located close the 6 O'Clock position of the pedical in AP tracterory. Biplanar projections were used to confirm position. Aspiration was confirmed to  be negative for CSF and/or blood. A 1-2 ml. volume of Isovue-250 was injected and flow of contrast was noted at each level. Radiographs were obtained for documentation purposes.   After attaining the desired flow of contrast documented above, a 0.5 to 1.0 ml test dose of 0.25% Marcaine was injected into each respective transforaminal space.  The patient was observed for 90 seconds post injection.  After no sensory deficits were reported, and normal lower extremity motor function was noted,   the above injectate was administered so that equal amounts of the injectate were placed at each foramen (level) into the transforaminal epidural space.   Additional Comments:  The patient tolerated the procedure well Dressing: 2 x 2 sterile gauze and Band-Aid    Post-procedure details: Patient was observed during the procedure. Post-procedure instructions were reviewed.  Patient left the clinic in stable condition.   Clinical History: MRI LUMBAR SPINE WITHOUT CONTRAST   TECHNIQUE: Multiplanar, multisequence MR imaging of the lumbar spine was performed. No intravenous contrast was administered.   COMPARISON:  None.   FINDINGS: Segmentation:  Standard.   Alignment:  Minimal anterolisthesis of L3 over L4.   Vertebrae: No fracture, evidence of discitis, or bone lesion. Endplate degenerative changes at L4-5. Large hemangioma within the body and right L3 pedicle.   Conus medullaris and cauda equina: Conus extends to the L1 level. Conus and cauda equina appear normal.   Paraspinal and other soft tissues: Negative.   Disc levels:   T12-L1: No spinal canal or neural foraminal stenosis.   L1-2: No spinal canal or neural foraminal stenosis.   L2-3: Facet degenerative changes. No spinal canal or neural foraminal stenosis.   L3-4: Disc bulge with prominent hypertrophic facet degenerative changes and ligamentum flavum redundancy resulting in mild bilateral neural foraminal narrowing. No  significant spinal canal stenosis.   L4-5: Disc bulge, moderate facet degenerative changes and ligamentum flavum redundancy resulting in mild narrowing of the bilateral subarticular zones, mild-to-moderate right and moderate left neural foraminal narrowing.   L5-S1: Mild facet degenerative changes. No spinal canal or neural foraminal stenosis.   IMPRESSION: 1. 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 2. Mild narrowing of the bilateral subarticular zones, mild-to-moderate right and moderate left neural foraminal narrowing at L4-5.     Electronically Signed   By: Pedro Earls M.D.   On: 07/28/2020 18:11     Objective:  VS:  HT:    WT:   BMI:     BP:129/87  HR:(!) 103bpm  TEMP: ( )  RESP:  Physical Exam Vitals and nursing note reviewed.  Constitutional:      General: She is not in acute distress.    Appearance: Normal appearance. She is obese. She is not ill-appearing.  HENT:     Head: Normocephalic and atraumatic.     Right Ear: External ear normal.     Left Ear: External ear normal.  Eyes:     Extraocular Movements: Extraocular movements intact.  Cardiovascular:     Rate and Rhythm: Normal rate.     Pulses: Normal pulses.  Pulmonary:  Effort: Pulmonary effort is normal. No respiratory distress.  Abdominal:     General: There is no distension.     Palpations: Abdomen is soft.  Musculoskeletal:        General: Tenderness present.     Cervical back: Neck supple.     Right lower leg: No edema.     Left lower leg: No edema.     Comments: Patient has good distal strength with no pain over the greater trochanters.  No clonus or focal weakness.  Skin:    Findings: No erythema, lesion or rash.  Neurological:     General: No focal deficit present.     Mental Status: She is alert and oriented to person, place, and time.     Sensory: No sensory deficit.     Motor: No  weakness or abnormal muscle tone.     Coordination: Coordination normal.  Psychiatric:        Mood and Affect: Mood normal.        Behavior: Behavior normal.     Imaging: No results found.

## 2021-05-06 NOTE — Patient Instructions (Signed)

## 2021-05-06 NOTE — Progress Notes (Signed)
Pt state lower back pain that travels down her right leg. Pt state walking, bending and laying down at night makes the pain worse. Pt state she take [place] meds and ice to help ease her pain. Pt has hx of inj on 12/22/20 pt state it helped.  Numeric Pain Rating Scale and Functional Assessment Average Pain 9   In the last MONTH (on 0-10 scale) has pain interfered with the following?  1. General activity like being  able to carry out your everyday physical activities such as walking, climbing stairs, carrying groceries, or moving a chair?  Rating(10)   +Driver, -BT, -Dye Allergies.

## 2021-05-11 NOTE — Procedures (Signed)
Lumbosacral Transforaminal Epidural Steroid Injection - Sub-Pedicular Approach with Fluoroscopic Guidance  Patient: Tracy Kelley      Date of Birth: 10/02/1971 MRN: [id number] PCP: Erven Colla, DO      Visit Date: 05/06/2021   Universal Protocol:    Date/Time: 05/06/2021  Consent Given By: the patient  Position: PRONE  Additional Comments: Vital signs were monitored before and after the procedure. Patient was prepped and draped in the usual sterile fashion. The correct patient, procedure, and site was verified.   Injection Procedure Details:   Procedure diagnoses: Lumbar radiculopathy [M54.16]    Meds Administered:  Meds ordered this encounter  Medications   methylPREDNISolone acetate (DEPO-MEDROL) injection 80 mg    Laterality: Right  Location/Site:  L4-L5  Needle:5.0 in., 22 ga.  Short bevel or Quincke spinal needle  Needle Placement: Transforaminal  Findings:    -Comments: Excellent flow of contrast along the nerve, nerve root and into the epidural space.  Procedure Details: After squaring off the end-plates to get a true AP view, the C-arm was positioned so that an oblique view of the foramen as noted above was visualized. The target area is just inferior to the "nose of the scotty dog" or sub pedicular. The soft tissues overlying this structure were infiltrated with 2-3 ml. of 1% Lidocaine without Epinephrine.  The spinal needle was inserted toward the target using a "trajectory" view along the fluoroscope beam.  Under AP and lateral visualization, the needle was advanced so it did not puncture dura and was located close the 6 O'Clock position of the pedical in AP tracterory. Biplanar projections were used to confirm position. Aspiration was confirmed to be negative for CSF and/or blood. A 1-2 ml. volume of Isovue-250 was injected and flow of contrast was noted at each level. Radiographs were obtained for documentation purposes.   After attaining the desired  flow of contrast documented above, a 0.5 to 1.0 ml test dose of 0.25% Marcaine was injected into each respective transforaminal space.  The patient was observed for 90 seconds post injection.  After no sensory deficits were reported, and normal lower extremity motor function was noted,   the above injectate was administered so that equal amounts of the injectate were placed at each foramen (level) into the transforaminal epidural space.   Additional Comments:  The patient tolerated the procedure well Dressing: 2 x 2 sterile gauze and Band-Aid    Post-procedure details: Patient was observed during the procedure. Post-procedure instructions were reviewed.  Patient left the clinic in stable condition.

## 2021-05-14 ENCOUNTER — Encounter: Payer: Self-pay | Admitting: Orthopaedic Surgery

## 2021-05-14 ENCOUNTER — Other Ambulatory Visit: Payer: Self-pay

## 2021-05-14 ENCOUNTER — Ambulatory Visit (INDEPENDENT_AMBULATORY_CARE_PROVIDER_SITE_OTHER): Payer: No Typology Code available for payment source | Admitting: Orthopaedic Surgery

## 2021-05-14 VITALS — BP 134/93 | HR 96 | Ht 69.0 in | Wt 215.4 lb

## 2021-05-14 DIAGNOSIS — M545 Low back pain, unspecified: Secondary | ICD-10-CM

## 2021-05-14 DIAGNOSIS — M79604 Pain in right leg: Secondary | ICD-10-CM

## 2021-05-14 MED ORDER — HYDROCODONE-ACETAMINOPHEN 5-325 MG PO TABS: ORAL_TABLET | ORAL | 0 refills | Status: DC

## 2021-05-14 NOTE — Addendum Note (Signed)
Addended by: Brand Males E on: 05/14/2021 08:25 AM   Modules accepted: Orders

## 2021-05-14 NOTE — Progress Notes (Signed)
My back is a little better but I sill have a limp.  She had epidural from Dr. Ernestina Patches.  I have reviewed the notes. She is better but she still has pain in the back but less paresthesias.  She has no new trauma, no weakness. She is doing her exercises and taking her medicine.  Spine/Pelvis examination:  Inspection:  Overall, sacoiliac joint benign and hips nontender; without crepitus or defects.   Thoracic spine inspection: Alignment normal without kyphosis present   Lumbar spine inspection:  Alignment  with normal lumbar lordosis, without scoliosis apparent.   Thoracic spine palpation:  without tenderness of spinal processes   Lumbar spine palpation: without tenderness of lumbar area; without tightness of lumbar muscles    Range of Motion:   Lumbar flexion, forward flexion is normal without pain or tenderness    Lumbar extension is full without pain or tenderness   Left lateral bend is normal without pain or tenderness   Right lateral bend is normal without pain or tenderness   Straight leg raising is normal  Strength & tone: normal   Stability overall normal stability  Encounter Diagnosis  Name Primary?   Lumbar pain with radiation down right leg Yes   I will refill pain medicine.  I have reviewed the [place] web site prior to prescribing narcotic medicine for this patient.  Return in one month.  Do walking exercises.  I went over these with her.  Call if any problem.  Precautions discussed.  Electronically Signed Sanjuana Kava, MD 7/21/20228:23 AM

## 2021-05-26 ENCOUNTER — Telehealth: Payer: Self-pay | Admitting: Orthopaedic Surgery

## 2021-05-26 MED ORDER — HYDROCODONE-ACETAMINOPHEN 5-325 MG PO TABS: ORAL_TABLET | ORAL | 0 refills | Status: DC

## 2021-05-26 NOTE — Telephone Encounter (Signed)
Patient requests refill on Hydrocodone/Acetaminophen 5-325 mgs.  Qty  20       Sig: One tablet every six hours for pain.  Limit 7 days.   Patient uses [facility]

## 2021-05-28 ENCOUNTER — Telehealth: Payer: Self-pay | Admitting: Family Medicine

## 2021-06-02 NOTE — Telephone Encounter (Signed)
Sent mychart message

## 2021-06-09 ENCOUNTER — Telehealth: Payer: Self-pay | Admitting: Orthopaedic Surgery

## 2021-06-09 ENCOUNTER — Ambulatory Visit (INDEPENDENT_AMBULATORY_CARE_PROVIDER_SITE_OTHER): Payer: No Typology Code available for payment source | Admitting: Orthopaedic Surgery

## 2021-06-09 ENCOUNTER — Other Ambulatory Visit: Payer: Self-pay

## 2021-06-09 ENCOUNTER — Encounter: Payer: Self-pay | Admitting: Orthopaedic Surgery

## 2021-06-09 VITALS — BP 128/89 | HR 108 | Ht 69.0 in | Wt 219.0 lb

## 2021-06-09 DIAGNOSIS — M79604 Pain in right leg: Secondary | ICD-10-CM | POA: Diagnosis not present

## 2021-06-09 DIAGNOSIS — M545 Low back pain, unspecified: Secondary | ICD-10-CM | POA: Diagnosis not present

## 2021-06-09 MED ORDER — HYDROCODONE-ACETAMINOPHEN 5-325 MG PO TABS: ORAL_TABLET | ORAL | 0 refills | Status: DC

## 2021-06-09 NOTE — Patient Instructions (Signed)
Physical therapy has been ordered for you at [facility]. They should call you to schedule, [phone] is the phone number to call , if you want to call to schedule. Please call them  if you do not hear anything within one week.  

## 2021-06-09 NOTE — Telephone Encounter (Signed)
Done

## 2021-06-09 NOTE — Progress Notes (Signed)
My pain is worse.  Her lower back pain has increased.  She had epidural by Dr. Ernestina Patches two months ago and did well with it for several weeks.  The pain has returned.  She has no new trauma.  She is taking her medicine and doing her exercises but it hurts to walk.  She has no numbness or weakness.  I will have her go to PT.  She is taking her NSAIDs.  I will refill pain medicine.  Spine/Pelvis examination:  Inspection:  Overall, sacoiliac joint benign and hips nontender; without crepitus or defects.   Thoracic spine inspection: Alignment normal without kyphosis present   Lumbar spine inspection:  Alignment  with normal lumbar lordosis, without scoliosis apparent.   Thoracic spine palpation:  without tenderness of spinal processes   Lumbar spine palpation: without tenderness of lumbar area; without tightness of lumbar muscles    Range of Motion:   Lumbar flexion, forward flexion is normal without pain or tenderness    Lumbar extension is full without pain or tenderness   Left lateral bend is normal without pain or tenderness   Right lateral bend is normal without pain or tenderness   Straight leg raising is normal  Strength & tone: normal   Stability overall normal stability Encounter Diagnosis  Name Primary?   Lumbar pain with radiation down right leg Yes   I have reviewed the [place] web site prior to prescribing narcotic medicine for this patient.  Return in three weeks.  Go to PT.  Call if any problem.  Precautions discussed.  Electronically Signed Sanjuana Kava, MD 8/16/20223:16 PM

## 2021-06-09 NOTE — Telephone Encounter (Signed)
Patient is aware of appointment 06/11/21 for follow up and to discuss medication. Patient relays she is already out of pain medication. Offered appointment today, to be seen and discuss refill at time of visit, although due to work, may patient have Virtual Visit today, 06/09/21?  If so, best time?

## 2021-06-09 NOTE — Telephone Encounter (Signed)
Patient called back; relays she will be able to come for in-person visit today, 06/09/21, said she was able to arrange it at work. Scheduled accordingly.

## 2021-06-11 ENCOUNTER — Ambulatory Visit: Payer: No Typology Code available for payment source | Admitting: Orthopaedic Surgery

## 2021-06-17 ENCOUNTER — Other Ambulatory Visit (HOSPITAL_COMMUNITY): Payer: Self-pay | Admitting: Family Medicine

## 2021-06-17 DIAGNOSIS — Z1231 Encounter for screening mammogram for malignant neoplasm of breast: Secondary | ICD-10-CM

## 2021-06-17 NOTE — Telephone Encounter (Signed)
Schedule appointment 9/14

## 2021-06-18 ENCOUNTER — Ambulatory Visit (HOSPITAL_COMMUNITY): Payer: No Typology Code available for payment source | Attending: Orthopaedic Surgery | Admitting: Physical Therapy

## 2021-06-18 ENCOUNTER — Other Ambulatory Visit: Payer: Self-pay

## 2021-06-18 ENCOUNTER — Encounter (HOSPITAL_COMMUNITY): Payer: Self-pay | Admitting: Physical Therapy

## 2021-06-18 DIAGNOSIS — M5416 Radiculopathy, lumbar region: Secondary | ICD-10-CM | POA: Insufficient documentation
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
## 2021-06-22 ENCOUNTER — Encounter (HOSPITAL_COMMUNITY): Payer: No Typology Code available for payment source | Admitting: Physical Therapy

## 2021-06-23 ENCOUNTER — Telehealth: Payer: Self-pay | Admitting: Orthopaedic Surgery

## 2021-06-23 MED ORDER — HYDROCODONE-ACETAMINOPHEN 5-325 MG PO TABS: ORAL_TABLET | ORAL | 0 refills | Status: DC

## 2021-06-23 NOTE — Telephone Encounter (Signed)
Patient requests refill: HYDROcodone-acetaminophen (NORCO/VICODIN) 5-325 MG tablet 26 tablet     - [employer], [place], [place]

## 2021-06-24 ENCOUNTER — Ambulatory Visit (HOSPITAL_COMMUNITY): Payer: No Typology Code available for payment source | Admitting: Physical Therapy

## 2021-06-24 ENCOUNTER — Other Ambulatory Visit: Payer: Self-pay

## 2021-06-24 ENCOUNTER — Encounter (HOSPITAL_COMMUNITY): Payer: Self-pay | Admitting: Physical Therapy

## 2021-06-24 DIAGNOSIS — M5416 Radiculopathy, lumbar region: Secondary | ICD-10-CM | POA: Diagnosis not present
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
## 2021-06-25 ENCOUNTER — Encounter (HOSPITAL_COMMUNITY): Payer: Self-pay | Admitting: Physical Therapy

## 2021-06-25 ENCOUNTER — Ambulatory Visit (HOSPITAL_COMMUNITY): Payer: PRIVATE HEALTH INSURANCE | Attending: Orthopaedic Surgery | Admitting: Physical Therapy

## 2021-06-25 DIAGNOSIS — M5416 Radiculopathy, lumbar region: Secondary | ICD-10-CM | POA: Diagnosis not present
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
## 2021-06-30 ENCOUNTER — Encounter (HOSPITAL_COMMUNITY): Payer: Self-pay | Admitting: Physical Therapy

## 2021-06-30 ENCOUNTER — Other Ambulatory Visit: Payer: Self-pay

## 2021-06-30 ENCOUNTER — Ambulatory Visit (HOSPITAL_COMMUNITY): Payer: PRIVATE HEALTH INSURANCE | Admitting: Physical Therapy

## 2021-06-30 DIAGNOSIS — M5416 Radiculopathy, lumbar region: Secondary | ICD-10-CM | POA: Diagnosis not present

## 2021-06-30 NOTE — Patient Instructions (Signed)
Access Code: [email or web address] URL: [email or web address] Date: 06/30/2021 Prepared by: Mitzi Hansen Brylan Dec  Exercises Hooklying Isometric Hip Abduction with Belt - 1 x daily - 7 x weekly - 10 reps - 10 second hold Beginner Bridge - 1 x daily - 7 x weekly - 2 sets - 10 reps
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
## 2021-07-01 ENCOUNTER — Encounter (HOSPITAL_COMMUNITY): Payer: Self-pay

## 2021-07-01 ENCOUNTER — Ambulatory Visit (HOSPITAL_COMMUNITY): Payer: PRIVATE HEALTH INSURANCE

## 2021-07-01 DIAGNOSIS — M5416 Radiculopathy, lumbar region: Secondary | ICD-10-CM | POA: Diagnosis not present
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
## 2021-07-02 ENCOUNTER — Ambulatory Visit (INDEPENDENT_AMBULATORY_CARE_PROVIDER_SITE_OTHER): Payer: No Typology Code available for payment source | Admitting: Orthopaedic Surgery

## 2021-07-02 ENCOUNTER — Encounter: Payer: Self-pay | Admitting: Orthopaedic Surgery

## 2021-07-02 ENCOUNTER — Other Ambulatory Visit: Payer: Self-pay

## 2021-07-02 ENCOUNTER — Encounter (HOSPITAL_COMMUNITY): Payer: No Typology Code available for payment source

## 2021-07-02 VITALS — BP 131/91 | HR 99 | Ht 69.0 in | Wt 216.1 lb

## 2021-07-02 DIAGNOSIS — M545 Low back pain, unspecified: Secondary | ICD-10-CM

## 2021-07-02 DIAGNOSIS — M79604 Pain in right leg: Secondary | ICD-10-CM | POA: Diagnosis not present

## 2021-07-02 MED ORDER — HYDROCODONE-ACETAMINOPHEN 5-325 MG PO TABS: ORAL_TABLET | ORAL | 0 refills | Status: DC

## 2021-07-02 NOTE — Progress Notes (Signed)
Therapy is helping.  Her lower back pain is less after going to PT.  She has been four times.  I have reviewed the notes.  She feels better.  She is moving about better.    Spine/Pelvis examination:  Inspection:  Overall, sacoiliac joint benign and hips nontender; without crepitus or defects.   Thoracic spine inspection: Alignment normal without kyphosis present   Lumbar spine inspection:  Alignment  with normal lumbar lordosis, without scoliosis apparent.   Thoracic spine palpation:  without tenderness of spinal processes   Lumbar spine palpation: without tenderness of lumbar area; without tightness of lumbar muscles    Range of Motion:   Lumbar flexion, forward flexion is normal without pain or tenderness    Lumbar extension is full without pain or tenderness   Left lateral bend is normal without pain or tenderness   Right lateral bend is normal without pain or tenderness   Straight leg raising is normal  Strength & tone: normal   Stability overall normal stability  Encounter Diagnosis  Name Primary?   Lumbar pain with radiation down right leg Yes   I will refill pain medicine.  I have reviewed the [place] web site prior to prescribing narcotic medicine for this patient.  Continue PT.  Return in one month.  Call if any problem.  Precautions discussed.  Electronically Signed Sanjuana Kava, MD 9/8/20228:28 AM

## 2021-07-07 ENCOUNTER — Ambulatory Visit (HOSPITAL_COMMUNITY): Payer: PRIVATE HEALTH INSURANCE

## 2021-07-07 ENCOUNTER — Other Ambulatory Visit: Payer: Self-pay

## 2021-07-07 ENCOUNTER — Encounter (HOSPITAL_COMMUNITY): Payer: Self-pay

## 2021-07-07 DIAGNOSIS — M5416 Radiculopathy, lumbar region: Secondary | ICD-10-CM | POA: Diagnosis not present
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
## 2021-07-08 ENCOUNTER — Encounter: Payer: Self-pay | Admitting: Family Medicine

## 2021-07-08 ENCOUNTER — Ambulatory Visit (INDEPENDENT_AMBULATORY_CARE_PROVIDER_SITE_OTHER): Payer: No Typology Code available for payment source | Admitting: Family Medicine

## 2021-07-08 VITALS — BP 128/84 | HR 89 | Ht 69.0 in | Wt 218.2 lb

## 2021-07-08 DIAGNOSIS — E119 Type 2 diabetes mellitus without complications: Secondary | ICD-10-CM | POA: Diagnosis not present

## 2021-07-08 DIAGNOSIS — I10 Essential (primary) hypertension: Secondary | ICD-10-CM

## 2021-07-08 DIAGNOSIS — F5105 Insomnia due to other mental disorder: Secondary | ICD-10-CM

## 2021-07-08 DIAGNOSIS — E785 Hyperlipidemia, unspecified: Secondary | ICD-10-CM | POA: Diagnosis not present

## 2021-07-08 DIAGNOSIS — F419 Anxiety disorder, unspecified: Secondary | ICD-10-CM

## 2021-07-08 MED ORDER — TEMAZEPAM 15 MG PO CAPS: ORAL_CAPSULE | ORAL | 0 refills | Status: DC

## 2021-07-08 MED ORDER — HYDROCHLOROTHIAZIDE 25 MG PO TABS: ORAL_TABLET | ORAL | 1 refills | Status: DC

## 2021-07-08 MED ORDER — FENOFIBRATE 160 MG PO TABS: ORAL_TABLET | ORAL | 1 refills | Status: DC

## 2021-07-08 MED ORDER — AMLODIPINE BESYLATE 10 MG PO TABS: ORAL_TABLET | ORAL | 1 refills | Status: DC

## 2021-07-08 MED ORDER — GLIPIZIDE 5 MG PO TABS: 5.0000 mg | ORAL_TABLET | Freq: Every day | ORAL | 1 refills | Status: DC

## 2021-07-08 NOTE — Progress Notes (Signed)
Patient ID: Tracy Kelley, female    DOB: 04-Sep-1971, 50 y.o.   MRN: [id number]   Chief Complaint  Patient presents with   Diabetes    Follow up   Subjective:   Patient needs refills of medication  HPI  Dm2- Compliant with medications. Checking blood glucose.   Not seeing any high or low numbers.  Denies polyuria or polydipsia.  Eye exam: needs one. Foot exam: no new concerns. Lab Results  Component Value Date   HGBA1C 7.3 (H) 07/08/2021    Doing well.  Seeing 120-140s glucose.  Rt leg sciatic is improving.  Lumbar radiculopathy.  Pt stating not taking norco as much now for leg pain, and wanting to have a refill of the temazepam for sleep.  Taking it prn.   HLD- doing well no new concerns.  Compliant with meds. No chest pain, palpitations, myalgias or joint pains.  HTN Pt compliant with BP meds.  No SEs Denies chest pain, sob, LE swelling, or blurry vision.   Medical History Anjelica has a past medical history of Anxiety, Diabetes mellitus without complication (HCC), Hyperlipidemia, Hypertension, and Thyroid disease.   Outpatient Encounter Medications as of 07/08/2021  Medication Sig   amLODipine (NORVASC) 10 MG tablet TAKE 1 TABLET(10 MG) BY MOUTH DAILY   fenofibrate 160 MG tablet TAKE 1 TABLET BY MOUTH DAILY   glipiZIDE (GLUCOTROL) 5 MG tablet Take 1 tablet (5 mg total) by mouth daily.   hydrochlorothiazide (HYDRODIURIL) 25 MG tablet TAKE 1 TABLET(25 MG) BY MOUTH DAILY   HYDROcodone-acetaminophen (NORCO/VICODIN) 5-325 MG tablet One tablet every six hours for pain.  Limit 7 days.   Lancets 30G MISC Please dispense lancets to go with patient's blood glucose monitor   Multiple Vitamin (MULTIVITAMIN WITH MINERALS) TABS tablet Take 1 tablet by mouth daily.   temazepam (RESTORIL) 15 MG capsule TAKE 1 CAPSULE BY MOUTH EVERY DAY AT BEDTIME   [DISCONTINUED] amLODipine (NORVASC) 10 MG tablet TAKE 1 TABLET(10 MG) BY MOUTH DAILY   [DISCONTINUED] fenofibrate 160 MG tablet  TAKE 1 TABLET BY MOUTH DAILY   [DISCONTINUED] glipiZIDE (GLUCOTROL) 5 MG tablet Take 1 tablet (5 mg total) by mouth daily.   [DISCONTINUED] hydrochlorothiazide (HYDRODIURIL) 25 MG tablet TAKE 1 TABLET(25 MG) BY MOUTH DAILY   [DISCONTINUED] temazepam (RESTORIL) 15 MG capsule TAKE 1 CAPSULE BY MOUTH EVERY DAY AT BEDTIME   No facility-administered encounter medications on file as of 07/08/2021.     Review of Systems  Constitutional:  Negative for chills and fever.  HENT:  Negative for congestion, rhinorrhea and sore throat.   Respiratory:  Negative for cough, shortness of breath and wheezing.   Cardiovascular:  Negative for chest pain and leg swelling.  Gastrointestinal:  Negative for abdominal pain, diarrhea, nausea and vomiting.  Genitourinary:  Negative for dysuria and frequency.  Musculoskeletal:  Negative for arthralgias and back pain.  Skin:  Negative for rash.  Neurological:  Negative for dizziness, weakness and headaches.    Vitals BP 128/84   Pulse 89   Ht 5\' 9"  (1.753 m)   Wt 218 lb 3.2 oz (99 kg)   BMI 32.22 kg/m   Objective:   Physical Exam Vitals and nursing note reviewed.  Constitutional:      Appearance: Normal appearance.  HENT:     Head: Normocephalic and atraumatic.     Nose: Nose normal.     Mouth/Throat:     Mouth: Mucous membranes are moist.     Pharynx: Oropharynx is clear.  Eyes:     Extraocular Movements: Extraocular movements intact.     Conjunctiva/sclera: Conjunctivae normal.     Pupils: Pupils are equal, round, and reactive to light.  Cardiovascular:     Rate and Rhythm: Normal rate and regular rhythm.     Pulses: Normal pulses.     Heart sounds: Normal heart sounds.  Pulmonary:     Effort: Pulmonary effort is normal.     Breath sounds: Normal breath sounds. No wheezing, rhonchi or rales.  Musculoskeletal:        General: Normal range of motion.     Right lower leg: No edema.     Left lower leg: No edema.  Skin:    General: Skin is warm  and dry.     Findings: No lesion or rash.  Neurological:     General: No focal deficit present.     Mental Status: She is alert and oriented to person, place, and time.  Psychiatric:        Mood and Affect: Mood normal.        Behavior: Behavior normal.     Assessment and Plan   1. Essential hypertension - Hemoglobin A1c - CMP14+EGFR - amLODipine (NORVASC) 10 MG tablet; TAKE 1 TABLET(10 MG) BY MOUTH DAILY  Dispense: 90 tablet; Refill: 1 - hydrochlorothiazide (HYDRODIURIL) 25 MG tablet; TAKE 1 TABLET(25 MG) BY MOUTH DAILY  Dispense: 90 tablet; Refill: 1  2. Hyperlipidemia, unspecified hyperlipidemia type - fenofibrate 160 MG tablet; TAKE 1 TABLET BY MOUTH DAILY  Dispense: 90 tablet; Refill: 1  3. Diabetes mellitus type 2 in nonobese (HCC) - Hemoglobin A1c - CMP14+EGFR - glipiZIDE (GLUCOTROL) 5 MG tablet; Take 1 tablet (5 mg total) by mouth daily.  Dispense: 90 tablet; Refill: 1  4. Insomnia secondary to anxiety - temazepam (RESTORIL) 15 MG capsule; TAKE 1 CAPSULE BY MOUTH EVERY DAY AT BEDTIME  Dispense: 30 capsule; Refill: 0   Stable.  Cont meds. Dm2- stable. Controlled. Cont watch carbs.  Cont meds.  Hld- cont fenofibrate.  Dec carbs.  Insomnia- cont with temazepam prn.  Htn-stable. Cont meds.  Return in about 6 months (around 01/05/2022).

## 2021-07-09 ENCOUNTER — Other Ambulatory Visit: Payer: Self-pay

## 2021-07-09 ENCOUNTER — Ambulatory Visit (HOSPITAL_COMMUNITY): Payer: PRIVATE HEALTH INSURANCE | Admitting: Physical Therapy

## 2021-07-09 ENCOUNTER — Encounter (HOSPITAL_COMMUNITY): Payer: Self-pay | Admitting: Physical Therapy

## 2021-07-09 ENCOUNTER — Encounter (HOSPITAL_COMMUNITY): Payer: No Typology Code available for payment source | Admitting: Physical Therapy

## 2021-07-09 DIAGNOSIS — M5416 Radiculopathy, lumbar region: Secondary | ICD-10-CM | POA: Diagnosis not present

## 2021-07-09 LAB — CMP14+EGFR
ALT: 11 IU/L (ref 0–32)
AST: 16 IU/L (ref 0–40)
Albumin/Globulin Ratio: 1.5 (ref 1.2–2.2)
Albumin: 4.5 g/dL (ref 3.8–4.8)
Alkaline Phosphatase: 79 IU/L (ref 44–121)
BUN/Creatinine Ratio: 14 (ref 9–23)
BUN: 14 mg/dL (ref 6–24)
Bilirubin Total: 0.4 mg/dL (ref 0.0–1.2)
CO2: 26 mmol/L (ref 20–29)
Calcium: 10.7 mg/dL — ABNORMAL HIGH (ref 8.7–10.2)
Chloride: 97 mmol/L (ref 96–106)
Creatinine, Ser: 1.02 mg/dL — ABNORMAL HIGH (ref 0.57–1.00)
Globulin, Total: 3 g/dL (ref 1.5–4.5)
Glucose: 135 mg/dL — ABNORMAL HIGH (ref 65–99)
Potassium: 3.6 mmol/L (ref 3.5–5.2)
Sodium: 140 mmol/L (ref 134–144)
Total Protein: 7.5 g/dL (ref 6.0–8.5)
eGFR: 67 mL/min/{1.73_m2} (ref >59)

## 2021-07-09 LAB — HEMOGLOBIN A1C
Est. average glucose Bld gHb Est-mCnc: 163 mg/dL
Hgb A1c MFr Bld: 7.3 % — ABNORMAL HIGH (ref 4.8–5.6)

## 2021-07-09 NOTE — Patient Instructions (Signed)
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
## 2021-07-14 ENCOUNTER — Other Ambulatory Visit: Payer: Self-pay

## 2021-07-14 ENCOUNTER — Ambulatory Visit (HOSPITAL_COMMUNITY): Payer: PRIVATE HEALTH INSURANCE

## 2021-07-14 ENCOUNTER — Encounter (HOSPITAL_COMMUNITY): Payer: Self-pay

## 2021-07-14 DIAGNOSIS — M5416 Radiculopathy, lumbar region: Secondary | ICD-10-CM | POA: Diagnosis not present

## 2021-07-14 NOTE — Patient Instructions (Signed)
Self Muscle energy techniques (MET)   Lay on your back with legs at 90/90.  Place towel behind Rt knee. During MET hold towel and press Rt leg into resistance while holding Lt knee still in the air at 90 degrees. Hold for 10 seconds.  Repeat 3-5 times until pain is reduced.
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
## 2021-07-16 ENCOUNTER — Ambulatory Visit (HOSPITAL_COMMUNITY): Payer: PRIVATE HEALTH INSURANCE | Admitting: Physical Therapy

## 2021-07-16 ENCOUNTER — Other Ambulatory Visit: Payer: Self-pay

## 2021-07-16 ENCOUNTER — Encounter (HOSPITAL_COMMUNITY): Payer: Self-pay | Admitting: Physical Therapy

## 2021-07-16 DIAGNOSIS — M5416 Radiculopathy, lumbar region: Secondary | ICD-10-CM
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
## 2021-07-21 ENCOUNTER — Telehealth: Payer: Self-pay | Admitting: Orthopaedic Surgery

## 2021-07-21 ENCOUNTER — Ambulatory Visit (HOSPITAL_COMMUNITY): Payer: PRIVATE HEALTH INSURANCE

## 2021-07-21 MED ORDER — HYDROCODONE-ACETAMINOPHEN 5-325 MG PO TABS: ORAL_TABLET | ORAL | 0 refills | Status: DC

## 2021-07-21 NOTE — Telephone Encounter (Signed)
Patient called requesting refill for her pain medicine.    HYDROcodone-acetaminophen (NORCO/VICODIN) 5-325 MG tablet  Pharmacy: [facility] on [address]  

## 2021-07-23 ENCOUNTER — Other Ambulatory Visit: Payer: Self-pay

## 2021-07-23 ENCOUNTER — Ambulatory Visit (HOSPITAL_COMMUNITY): Payer: PRIVATE HEALTH INSURANCE | Admitting: Physical Therapy

## 2021-07-23 DIAGNOSIS — M5416 Radiculopathy, lumbar region: Secondary | ICD-10-CM | POA: Diagnosis not present
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
## 2021-07-28 ENCOUNTER — Ambulatory Visit (HOSPITAL_COMMUNITY): Payer: No Typology Code available for payment source | Attending: Orthopaedic Surgery | Admitting: Physical Therapy

## 2021-07-28 ENCOUNTER — Other Ambulatory Visit: Payer: Self-pay

## 2021-07-28 DIAGNOSIS — R262 Difficulty in walking, not elsewhere classified: Secondary | ICD-10-CM | POA: Insufficient documentation

## 2021-07-28 DIAGNOSIS — M6281 Muscle weakness (generalized): Secondary | ICD-10-CM | POA: Diagnosis present

## 2021-07-28 DIAGNOSIS — M5416 Radiculopathy, lumbar region: Secondary | ICD-10-CM | POA: Diagnosis not present
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
## 2021-07-29 ENCOUNTER — Ambulatory Visit (HOSPITAL_COMMUNITY): Payer: No Typology Code available for payment source | Admitting: Physical Therapy

## 2021-07-29 ENCOUNTER — Encounter (HOSPITAL_COMMUNITY): Payer: Self-pay | Admitting: Physical Therapy

## 2021-07-29 DIAGNOSIS — M5416 Radiculopathy, lumbar region: Secondary | ICD-10-CM
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
## 2021-07-30 ENCOUNTER — Other Ambulatory Visit: Payer: Self-pay

## 2021-07-30 ENCOUNTER — Encounter: Payer: Self-pay | Admitting: Orthopaedic Surgery

## 2021-07-30 ENCOUNTER — Encounter (HOSPITAL_COMMUNITY): Payer: No Typology Code available for payment source | Admitting: Physical Therapy

## 2021-07-30 ENCOUNTER — Ambulatory Visit (INDEPENDENT_AMBULATORY_CARE_PROVIDER_SITE_OTHER): Payer: No Typology Code available for payment source | Admitting: Orthopaedic Surgery

## 2021-07-30 VITALS — BP 127/85 | HR 106 | Ht 69.0 in | Wt 216.8 lb

## 2021-07-30 DIAGNOSIS — M545 Low back pain, unspecified: Secondary | ICD-10-CM

## 2021-07-30 DIAGNOSIS — M79604 Pain in right leg: Secondary | ICD-10-CM | POA: Diagnosis not present

## 2021-07-30 MED ORDER — HYDROCODONE-ACETAMINOPHEN 5-325 MG PO TABS: ORAL_TABLET | ORAL | 0 refills | Status: DC

## 2021-07-30 NOTE — Progress Notes (Signed)
PT is helping.  She is going to PT and she says it is helping.  I have reviewed the PT notes.  She says the exercises she does at home help too.  But she still has good and bad days.  She is taking her medicine. She has no numbness, no trauma, no weakness.  Spine/Pelvis examination:  Inspection:  Overall, sacoiliac joint benign and hips nontender; without crepitus or defects.   Thoracic spine inspection: Alignment normal without kyphosis present   Lumbar spine inspection:  Alignment  with normal lumbar lordosis, without scoliosis apparent.   Thoracic spine palpation:  without tenderness of spinal processes   Lumbar spine palpation: without tenderness of lumbar area; without tightness of lumbar muscles    Range of Motion:   Lumbar flexion, forward flexion is normal without pain or tenderness    Lumbar extension is full without pain or tenderness   Left lateral bend is normal without pain or tenderness   Right lateral bend is normal without pain or tenderness   Straight leg raising is normal  Strength & tone: normal   Stability overall normal stability  Encounter Diagnosis  Name Primary?   Lumbar pain with radiation down right leg Yes   I will refill pain medicine.  I have reviewed the [place] web site prior to prescribing narcotic medicine for this patient.  Continue PT.  Return in one month.  Call if any problem.  Precautions discussed.  Electronically Signed Sanjuana Kava, MD 10/6/20228:20 AM

## 2021-08-03 ENCOUNTER — Other Ambulatory Visit: Payer: Self-pay

## 2021-08-03 ENCOUNTER — Encounter (HOSPITAL_COMMUNITY): Payer: Self-pay | Admitting: Physical Therapy

## 2021-08-03 ENCOUNTER — Ambulatory Visit (HOSPITAL_COMMUNITY): Payer: No Typology Code available for payment source | Admitting: Physical Therapy

## 2021-08-03 DIAGNOSIS — R262 Difficulty in walking, not elsewhere classified: Secondary | ICD-10-CM

## 2021-08-03 DIAGNOSIS — M5416 Radiculopathy, lumbar region: Secondary | ICD-10-CM

## 2021-08-03 DIAGNOSIS — M6281 Muscle weakness (generalized): Secondary | ICD-10-CM
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
## 2021-08-04 ENCOUNTER — Encounter (HOSPITAL_COMMUNITY): Payer: No Typology Code available for payment source | Admitting: Physical Therapy

## 2021-08-06 ENCOUNTER — Other Ambulatory Visit: Payer: Self-pay

## 2021-08-06 ENCOUNTER — Encounter (HOSPITAL_COMMUNITY): Payer: Self-pay | Admitting: Physical Therapy

## 2021-08-06 ENCOUNTER — Ambulatory Visit (HOSPITAL_COMMUNITY): Payer: No Typology Code available for payment source | Admitting: Physical Therapy

## 2021-08-06 DIAGNOSIS — R262 Difficulty in walking, not elsewhere classified: Secondary | ICD-10-CM

## 2021-08-06 DIAGNOSIS — M6281 Muscle weakness (generalized): Secondary | ICD-10-CM

## 2021-08-06 DIAGNOSIS — M5416 Radiculopathy, lumbar region: Secondary | ICD-10-CM | POA: Diagnosis not present
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
## 2021-08-10 ENCOUNTER — Other Ambulatory Visit: Payer: Self-pay

## 2021-08-10 ENCOUNTER — Other Ambulatory Visit (HOSPITAL_COMMUNITY): Payer: Self-pay | Admitting: Nurse Practitioner

## 2021-08-10 ENCOUNTER — Ambulatory Visit (HOSPITAL_COMMUNITY)
Admission: RE | Admit: 2021-08-10 | Discharge: 2021-08-10 | Disposition: A | Discharge status: Home / Self Care | Payer: No Typology Code available for payment source | Source: Ambulatory Visit | Attending: Family Medicine | Admitting: Family Medicine

## 2021-08-10 DIAGNOSIS — Z1231 Encounter for screening mammogram for malignant neoplasm of breast: Secondary | ICD-10-CM

## 2021-08-11 ENCOUNTER — Ambulatory Visit: Payer: No Typology Code available for payment source

## 2021-08-11 ENCOUNTER — Ambulatory Visit (INDEPENDENT_AMBULATORY_CARE_PROVIDER_SITE_OTHER): Payer: No Typology Code available for payment source | Admitting: Orthopaedic Surgery

## 2021-08-11 ENCOUNTER — Other Ambulatory Visit (HOSPITAL_COMMUNITY): Payer: Self-pay

## 2021-08-11 ENCOUNTER — Encounter: Payer: Self-pay | Admitting: Orthopaedic Surgery

## 2021-08-11 VITALS — BP 123/87 | HR 105 | Ht 69.0 in | Wt 214.1 lb

## 2021-08-11 DIAGNOSIS — G8929 Other chronic pain: Secondary | ICD-10-CM

## 2021-08-11 DIAGNOSIS — M87051 Idiopathic aseptic necrosis of right femur: Secondary | ICD-10-CM

## 2021-08-11 DIAGNOSIS — M25551 Pain in right hip: Secondary | ICD-10-CM | POA: Diagnosis not present

## 2021-08-11 MED ORDER — HYDROCODONE-ACETAMINOPHEN 5-325 MG PO TABS: ORAL_TABLET | ORAL | 0 refills | Status: DC

## 2021-08-11 MED ORDER — INFLUENZA VAC SPLIT QUAD 0.5 ML IM SUSY
PREFILLED_SYRINGE | INTRAMUSCULAR | 0 refills | Status: DC
  Filled 2021-08-11: qty 0.5, 1d supply, fill #0

## 2021-08-11 NOTE — Patient Instructions (Signed)
Please call Central Scheduling at [phone] and schedule your MRI. We will be working to get permission from your insurance for the MRI.

## 2021-08-11 NOTE — Progress Notes (Signed)
My hip is hurting more.  She has been to PT for her back.  That helped but her right hip is very painful.  She is limping.   She has decreased ROM of the right hip, internal 10, external 20, forward 90, abduction 30, adduction full, extension 5 with decided limp on the right.  NV intact.  X-rays were done of the right hip, reported separately.  Encounter Diagnoses  Name Primary?   Chronic pain of right hip Yes   Avascular necrosis of bone of right hip ([place])    I have explained to her the findings of the xray and the need for MRI.  I have shown her the X-rays.  Get the MRI of the right hip.  I will refill pain medicine.  I have reviewed the [place] web site prior to prescribing narcotic medicine for this patient.  Return in three weeks.  Call if any problem.  Precautions discussed.  Electronically Signed Sanjuana Kava, MD 10/18/20228:48 AM

## 2021-08-12 ENCOUNTER — Other Ambulatory Visit (HOSPITAL_COMMUNITY): Payer: Self-pay

## 2021-08-19 ENCOUNTER — Ambulatory Visit (HOSPITAL_COMMUNITY): Payer: No Typology Code available for payment source

## 2021-08-20 ENCOUNTER — Other Ambulatory Visit: Payer: Self-pay | Admitting: Orthopaedic Surgery

## 2021-08-20 MED ORDER — HYDROCODONE-ACETAMINOPHEN 5-325 MG PO TABS: ORAL_TABLET | ORAL | 0 refills | Status: DC

## 2021-08-20 NOTE — Telephone Encounter (Signed)
Patient called requesting refill for her pain medicine.    HYDROcodone-acetaminophen (NORCO/VICODIN) 5-325 MG tablet  Pharmacy:  [facility] on [address]

## 2021-08-20 NOTE — Telephone Encounter (Signed)
  Tracy Kelley,   The patient called this morning and asked for a refill.  I can't find the telephone call to attach to this note.  Patient requested it be called into [facility] on [address]  It was sent to [facility].     She would like it to be sent to the one on [address].   Please call her back at [phone]    Me   CB   10:58 AM Note Patient called requesting refill for her pain medicine.      HYDROcodone-acetaminophen (NORCO/VICODIN) 5-325 MG tablet   Pharmacy:  [facility] on [address]

## 2021-08-28 ENCOUNTER — Other Ambulatory Visit: Payer: Self-pay

## 2021-08-28 ENCOUNTER — Ambulatory Visit (HOSPITAL_COMMUNITY)
Admission: RE | Admit: 2021-08-28 | Discharge: 2021-08-28 | Disposition: A | Discharge status: Home / Self Care | Payer: PRIVATE HEALTH INSURANCE | Source: Ambulatory Visit | Attending: Orthopaedic Surgery | Admitting: Orthopaedic Surgery

## 2021-08-28 DIAGNOSIS — G8929 Other chronic pain: Secondary | ICD-10-CM | POA: Insufficient documentation

## 2021-08-28 DIAGNOSIS — M25551 Pain in right hip: Secondary | ICD-10-CM | POA: Diagnosis present

## 2021-09-01 ENCOUNTER — Other Ambulatory Visit: Payer: Self-pay

## 2021-09-01 ENCOUNTER — Ambulatory Visit (INDEPENDENT_AMBULATORY_CARE_PROVIDER_SITE_OTHER): Payer: No Typology Code available for payment source | Admitting: Orthopaedic Surgery

## 2021-09-01 ENCOUNTER — Encounter: Payer: Self-pay | Admitting: Orthopaedic Surgery

## 2021-09-01 DIAGNOSIS — M87051 Idiopathic aseptic necrosis of right femur: Secondary | ICD-10-CM

## 2021-09-01 MED ORDER — HYDROCODONE-ACETAMINOPHEN 5-325 MG PO TABS: ORAL_TABLET | ORAL | 0 refills | Status: DC

## 2021-09-01 NOTE — Progress Notes (Addendum)
My hip still hurts.  Tracy Kelley had the MRI of the right hip and it showed: IMPRESSION: 1. 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 2. Moderate left hip joint degenerative changes but no joint effusion or stress fracture. 3. Intact bony pelvis. 4. No findings for peritendinitis, trochanteric bursitis or significant muscle abnormality.  I have explained the findings to her.  I will have her see Dr Ninfa Linden for further treatment.  Tracy Kelley agrees.  I have independently reviewed the MRI.    Her hip is tender with decreased motion.  NV intact.  I have answered many of her questions.  Encounter Diagnosis  Name Primary?   Avascular necrosis of bone of right hip ([place]) Yes   I have reviewed the [place] web site prior to prescribing narcotic medicine for this patient.   To see Dr. Ninfa Linden.  Call if any problem.  Precautions discussed.  Electronically Signed Sanjuana Kava, MD 11/8/20228:15 AM

## 2021-09-01 NOTE — Addendum Note (Signed)
Addended by: Willette Pa on: 09/01/2021 08:40 AM   Modules accepted: Orders

## 2021-09-08 ENCOUNTER — Telehealth: Payer: Self-pay | Admitting: Orthopaedic Surgery

## 2021-09-08 MED ORDER — HYDROCODONE-ACETAMINOPHEN 5-325 MG PO TABS: ORAL_TABLET | ORAL | 0 refills | Status: DC

## 2021-09-08 NOTE — Telephone Encounter (Signed)
Patient called requesting refill for her pain medicine.     HYDROcodone-acetaminophen (NORCO/VICODIN) 5-325 MG tablet  Pharmacy: [facility] on [address]

## 2021-09-10 ENCOUNTER — Other Ambulatory Visit (HOSPITAL_COMMUNITY): Payer: Self-pay

## 2021-09-14 ENCOUNTER — Other Ambulatory Visit: Payer: Self-pay

## 2021-09-14 ENCOUNTER — Ambulatory Visit (INDEPENDENT_AMBULATORY_CARE_PROVIDER_SITE_OTHER): Payer: No Typology Code available for payment source | Admitting: Orthopaedic Surgery

## 2021-09-14 VITALS — Ht 69.0 in | Wt 214.0 lb

## 2021-09-14 DIAGNOSIS — M87051 Idiopathic aseptic necrosis of right femur: Secondary | ICD-10-CM | POA: Diagnosis not present

## 2021-09-14 MED ORDER — TIZANIDINE HCL 4 MG PO TABS: 4.0000 mg | ORAL_TABLET | Freq: Three times a day (TID) | ORAL | 1 refills | Status: DC | PRN
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
## 2021-09-21 ENCOUNTER — Telehealth: Payer: Self-pay | Admitting: Orthopaedic Surgery

## 2021-09-21 MED ORDER — HYDROCODONE-ACETAMINOPHEN 5-325 MG PO TABS: ORAL_TABLET | ORAL | 0 refills | Status: DC

## 2021-09-21 NOTE — Telephone Encounter (Signed)
Patient requests refill on pain medication HYDROcodone-acetaminophen (NORCO/VICODIN) 5-325 MG tablet 28 tablet      Patient uses [facility] on [address] was to be changed last time

## 2021-09-21 NOTE — Telephone Encounter (Signed)
done

## 2021-09-29 ENCOUNTER — Other Ambulatory Visit: Payer: Self-pay | Admitting: Orthopaedic Surgery

## 2021-10-01 ENCOUNTER — Telehealth: Payer: Self-pay | Admitting: Orthopaedic Surgery

## 2021-10-01 NOTE — Telephone Encounter (Signed)
Patient called to request refill: HYDROcodone-acetaminophen (NORCO/VICODIN) 5-325 MG tablet 28 tablet              [facility], [address], [place]                 - patient has been seen by Dr Ninfa Linden per referral; states surgery will not be until January

## 2021-10-05 MED ORDER — HYDROCODONE-ACETAMINOPHEN 5-325 MG PO TABS: ORAL_TABLET | ORAL | 0 refills | Status: DC

## 2021-10-05 NOTE — Addendum Note (Signed)
Addended by: Willette Pa on: 10/05/2021 04:15 PM   Modules accepted: Orders

## 2021-10-05 NOTE — Addendum Note (Signed)
Addended by: Brand Males E on: 10/05/2021 03:27 PM   Modules accepted: Orders

## 2021-10-09 ENCOUNTER — Other Ambulatory Visit: Payer: Self-pay

## 2021-10-09 ENCOUNTER — Ambulatory Visit (INDEPENDENT_AMBULATORY_CARE_PROVIDER_SITE_OTHER): Payer: No Typology Code available for payment source | Admitting: Nurse Practitioner

## 2021-10-09 VITALS — BP 128/82 | Ht 69.0 in | Wt 210.0 lb

## 2021-10-09 DIAGNOSIS — Z01419 Encounter for gynecological examination (general) (routine) without abnormal findings: Secondary | ICD-10-CM

## 2021-10-09 DIAGNOSIS — N912 Amenorrhea, unspecified: Secondary | ICD-10-CM | POA: Diagnosis not present

## 2021-10-09 DIAGNOSIS — R9230 Dense breasts, unspecified: Secondary | ICD-10-CM

## 2021-10-09 DIAGNOSIS — R922 Inconclusive mammogram: Secondary | ICD-10-CM

## 2021-10-09 NOTE — Progress Notes (Signed)
Subjective:    Patient ID: Tracy Kelley, female    DOB: 1971/07/15, 50 y.o.   MRN: [id number]  HPI  The patient comes in today for a wellness visit.    A review of their health history was completed.  A review of medications was also completed.  Any needed refills; no  Eating habits: trying to eat healthy  Falls/  MVA accidents in past few months: no  Regular exercise: having hip replacement surgery 11/10/21; limited activity  Specialist pt sees on regular basis: ortho  Preventative health issues were discussed.   Additional concerns: no  Last cycle was in February 2022, no vaginal bleeding or spotting since then.  Same sexual partner.  Blood sugars at home fasting running 110-120.  Denies any hypoglycemia.  Regular eye exams every 6 months.  Regular dental exams. Defers foot exam, patient is wearing compression stockings today and wishes to do this at her next visit.  Denies any pain or burning in the feet.  No numbness or lack of sensation. No first-degree relatives with a history of breast cancer. Review of Systems  Constitutional:  Positive for activity change. Negative for appetite change.  HENT:  Negative for sore throat and trouble swallowing.   Respiratory:  Negative for cough, chest tightness, shortness of breath and wheezing.   Cardiovascular:  Negative for chest pain.  Gastrointestinal:  Negative for abdominal distention, abdominal pain, blood in stool, constipation, diarrhea, nausea and vomiting.  Genitourinary:  Negative for difficulty urinating, dysuria, enuresis, frequency, genital sores, menstrual problem, pelvic pain, urgency, vaginal bleeding and vaginal discharge.  Depression screen PHQ 2/9 10/09/2021  Decreased Interest 0  Down, Depressed, Hopeless 0  PHQ - 2 Score 0       Objective:   Physical Exam Vitals and nursing note reviewed.  Constitutional:      General: She is not in acute distress.    Appearance: She is well-developed.  Neck:      Thyroid: No thyromegaly.     Trachea: No tracheal deviation.     Comments: Thyroid non tender to palpation. No mass or goiter noted.  Cardiovascular:     Rate and Rhythm: Normal rate and regular rhythm.     Heart sounds: Normal heart sounds. No murmur heard. Pulmonary:     Effort: Pulmonary effort is normal.     Breath sounds: Normal breath sounds.  Chest:  Breasts:    Right: No swelling, inverted nipple, mass, skin change or tenderness.     Left: No swelling, inverted nipple, mass, skin change or tenderness.  Abdominal:     General: There is no distension.     Palpations: Abdomen is soft.     Tenderness: There is no abdominal tenderness.  Genitourinary:    Comments: Defers GU and pelvic exams.  Denies any problems.  Patient unable to get into the lithotomy position due to hip pain. Musculoskeletal:     Cervical back: Normal range of motion and neck supple.  Lymphadenopathy:     Cervical: No cervical adenopathy.     Upper Body:     Right upper body: No supraclavicular, axillary or pectoral adenopathy.     Left upper body: No supraclavicular, axillary or pectoral adenopathy.  Skin:    General: Skin is warm and dry.     Findings: No rash.  Neurological:     Mental Status: She is alert and oriented to person, place, and time.  Psychiatric:        Mood and  Affect: Mood normal.        Behavior: Behavior normal.        Thought Content: Thought content normal.        Judgment: Judgment normal.   Today's Vitals   10/09/21 0841  BP: 128/82  Weight: 210 lb (95.3 kg)  Height: 5\' 9"  (1.753 m)   Body mass index is 31.01 kg/m.  Tyrer-Cuzick lifetime breast cancer risk 11.4.      Assessment & Plan:   Problem List Items Addressed This Visit       Other   Amenorrhea   Dense breasts   Other Visit Diagnoses     Well woman exam    -  Primary      Continue to monitor blood sugar.  Activity as tolerated.  Encourage patient to eat a diet low in sugar and simple carbs.  She  will have preop lab work done before her hip replacement surgery in January.  Discussed amenorrhea with patient.  Hold on [facility] today.  Patient to contact office if she has any vaginal bleeding or spotting. Also plan to schedule colonoscopy after recuperation from her upcoming surgery, plan to discuss this at her next visit. Reviewed vaccines for her age. Return in about 3 months (around 01/07/2022).

## 2021-10-10 ENCOUNTER — Encounter: Payer: Self-pay | Admitting: Nurse Practitioner

## 2021-10-10 DIAGNOSIS — N912 Amenorrhea, unspecified: Secondary | ICD-10-CM | POA: Insufficient documentation

## 2021-10-10 DIAGNOSIS — R9230 Dense breasts, unspecified: Secondary | ICD-10-CM | POA: Insufficient documentation

## 2021-10-10 DIAGNOSIS — R922 Inconclusive mammogram: Secondary | ICD-10-CM | POA: Insufficient documentation

## 2021-10-13 ENCOUNTER — Encounter (HOSPITAL_COMMUNITY): Payer: Self-pay | Admitting: Physical Therapy

## 2021-10-13 NOTE — Therapy (Signed)
[place], [place], [address] Phone: [phone]   Fax:  [phone]  Patient Details  Name: Tracy GERSTENBERGER MRN: [id number] Date of Birth: July 03, 1971 Referring Provider:  No ref. provider found  Encounter Date: 10/13/2021   PHYSICAL THERAPY DISCHARGE SUMMARY  Visits from Start of Care: 14  Current functional level related to goals / functional outcomes: PT 60% better   Remaining deficits: Pain    Education / Equipment: HEP   Patient agrees to discharge. Patient goals were partially met. Patient is being discharged due to not returning since the last visit.  Rayetta Humphrey, PT CLT [phone]  10/13/2021, 8:46 AM  [place], [place], [address] Phone: [phone]   Fax:  [phone]

## 2021-10-14 ENCOUNTER — Telehealth: Payer: Self-pay | Admitting: Orthopaedic Surgery

## 2021-10-22 ENCOUNTER — Telehealth: Payer: Self-pay | Admitting: Family Medicine

## 2021-10-22 ENCOUNTER — Other Ambulatory Visit: Payer: Self-pay | Admitting: Radiology

## 2021-10-22 ENCOUNTER — Other Ambulatory Visit: Payer: Self-pay | Admitting: Orthopaedic Surgery

## 2021-10-22 MED ORDER — HYDROCODONE-ACETAMINOPHEN 5-325 MG PO TABS: ORAL_TABLET | ORAL | 0 refills | Status: DC

## 2021-10-22 NOTE — Telephone Encounter (Signed)
Pt would like Temazepam 15 mg sent to pharmacy. Pt states she has not used it in a while; last fill was August/September last year. Please advise. Thank you

## 2021-10-22 NOTE — Telephone Encounter (Signed)
Pt called and would like her  temazepam, called into [facility] on [address]   [phone]

## 2021-10-23 ENCOUNTER — Other Ambulatory Visit: Payer: Self-pay | Admitting: Family Medicine

## 2021-10-23 NOTE — Telephone Encounter (Signed)
Patient notified and will wait for Dr Jonathon Jordan decision next week- Patient states he has taken this medication in the past

## 2021-10-23 NOTE — Telephone Encounter (Signed)
Pt called re Rx. I told her 24  turnaround time.  [phone]

## 2021-10-23 NOTE — Telephone Encounter (Signed)
Nurses I am not comfortable with prescribing temazepam for individuals who is currently on opioids Currently healthcare systems and federal government/State medical Board discourage prescribing sleep pills to individuals who are on opioids as well because of the potential for drug interactions (even if they do not take it at the same time) Please inform patient I will not be able to fill this medication today thank you Dr. Lacinda Axon is out today with sickness This issue will have to wait till next week when Dr. Lacinda Axon comes back

## 2021-10-27 NOTE — Telephone Encounter (Signed)
Patient advised per Dr Lacinda Axon: Agree with Dr. Wolfgang Phoenix.Dr Lacinda Axon has not seen this patient yet and he would recommend trying other options regarding sleep. Will discuss at patient's visit patient verbalized understanding and stated she will call back with appt.

## 2021-10-27 NOTE — Telephone Encounter (Signed)
Coral Spikes, DO    I agree with Dr. Wolfgang Phoenix. I have not seen this patient yet and I would recommend trying other options regarding sleep. Will discuss at patient's visit.

## 2021-11-02 ENCOUNTER — Other Ambulatory Visit: Payer: Self-pay | Admitting: Physician Assistant

## 2021-11-02 DIAGNOSIS — M87051 Idiopathic aseptic necrosis of right femur: Secondary | ICD-10-CM

## 2021-11-03 ENCOUNTER — Telehealth: Payer: Self-pay | Admitting: Radiology

## 2021-11-03 MED ORDER — HYDROCODONE-ACETAMINOPHEN 5-325 MG PO TABS: ORAL_TABLET | ORAL | 0 refills | Status: DC

## 2021-11-04 ENCOUNTER — Other Ambulatory Visit: Payer: Self-pay

## 2021-11-05 NOTE — Progress Notes (Signed)
Surgical Instructions    Your procedure is scheduled on 11/10/21.  Report to [facility] Main Entrance "A" at 07:30 A.M., then check in with the Admitting office.  Call this number if you have problems the morning of surgery:  [phone]   If you have any questions prior to your surgery date call [phone]: Open Monday-Friday 8am-4pm    Remember:  Do not eat after midnight the night before your surgery  You may drink clear liquids until 6:30am the morning of your surgery.   Clear liquids allowed are: Water, Non-Citrus Juices (without pulp), Carbonated Beverages, Clear Tea, Black Coffee ONLY (NO MILK, CREAM OR POWDERED CREAMER of any kind), and Gatorade  Patient Instructions  The night before surgery:  No food after midnight. ONLY clear liquids after midnight   The day of surgery (if you have diabetes): Drink ONE (1) 12 oz G2 given to you in your pre admission testing appointment by 6:30am the morning of surgery. Drink in one sitting. Do not sip.  This drink was given to you during your hospital  pre-op appointment visit.  Nothing else to drink after completing the  12 oz bottle of G2.         If you have questions, please contact your surgeons office.     Take these medicines the morning of surgery with A SIP OF WATER  amLODipine (NORVASC)  IF NEEDED:  HYDROcodone-acetaminophen (NORCO/VICODIN) tiZANidine (ZANAFLEX)   As of today, STOP taking any Aspirin (unless otherwise instructed by your surgeon) Aleve, Naproxen, Ibuprofen, Motrin, Advil, Goody's, BC's, all herbal medications, fish oil, and all vitamins.  WHAT DO I DO ABOUT MY DIABETES MEDICATION?   Do not take oral diabetes medicines (pills) the morning of surgery.   THE MORNING OF SURGERY, do not take glipiZIDE (GLUCOTROL).  The day of surgery, do not take other diabetes injectables, including Byetta (exenatide), Bydureon (exenatide ER), Victoza (liraglutide), or Trulicity (dulaglutide).  If your CBG is  greater than 220 mg/dL, you may take  of your sliding scale (correction) dose of insulin.   HOW TO MANAGE YOUR DIABETES BEFORE AND AFTER SURGERY  Why is it important to control my blood sugar before and after surgery? Improving blood sugar levels before and after surgery helps healing and can limit problems. A way of improving blood sugar control is eating a healthy diet by:  Eating less sugar and carbohydrates  Increasing activity/exercise  Talking with your doctor about reaching your blood sugar goals High blood sugars (greater than 180 mg/dL) can raise your risk of infections and slow your recovery, so you will need to focus on controlling your diabetes during the weeks before surgery. Make sure that the doctor who takes care of your diabetes knows about your planned surgery including the date and location.  How do I manage my blood sugar before surgery? Check your blood sugar at least 4 times a day, starting 2 days before surgery, to make sure that the level is not too high or low.  Check your blood sugar the morning of your surgery when you wake up and every 2 hours until you get to the Short Stay unit.  If your blood sugar is less than 70 mg/dL, you will need to treat for low blood sugar: Do not take insulin. Treat a low blood sugar (less than 70 mg/dL) with  cup of clear juice (cranberry or apple), 4 glucose tablets, OR glucose gel. Recheck blood sugar in 15 minutes after treatment (to make sure it is  greater than 70 mg/dL). If your blood sugar is not greater than 70 mg/dL on recheck, call [phone] for further instructions. Report your blood sugar to the short stay nurse when you get to Short Stay.  If you are admitted to the hospital after surgery: Your blood sugar will be checked by the staff and you will probably be given insulin after surgery (instead of oral diabetes medicines) to make sure you have good blood sugar levels. The goal for blood sugar control after surgery  is 80-180 mg/dL.    After your COVID test   You are not required to quarantine however you are required to wear a well-fitting mask when you are out and around people not in your household.  If your mask becomes wet or soiled, replace with a new one.  Wash your hands often with soap and water for 20 seconds or clean your hands with an alcohol-based hand sanitizer that contains at least 60% alcohol.  Do not share personal items.  Notify your provider: if you are in close contact with someone who has COVID  or if you develop a fever of 100.4 or greater, sneezing, cough, sore throat, shortness of breath or body aches.             Do not wear jewelry or makeup Do not wear lotions, powders, perfumes/colognes, or deodorant. Do not shave 48 hours prior to surgery.   Do not bring valuables to the hospital. DO Not wear nail polish, gel polish, artificial nails, or any other type of covering on natural nails including finger and toenails. If patients have artificial nails, gel coating, etc. that need to be removed by a nail salon, please have this removed prior to surgery or surgery may need to be canceled/delayed if the surgeon/ anesthesia feels like the patient is unable to be adequately monitored.             [facility] is not responsible for any belongings or valuables.  Do NOT Smoke (Tobacco/Vaping)  24 hours prior to your procedure  If you use a CPAP at night, you may bring your mask for your overnight stay.   Contacts, glasses, hearing aids, dentures or partials may not be worn into surgery, please bring cases for these belongings   For patients admitted to the hospital, discharge time will be determined by your treatment team.   Patients discharged the day of surgery will not be allowed to drive home, and someone needs to stay with them for 24 hours.  NO VISITORS WILL BE ALLOWED IN PRE-OP WHERE PATIENTS ARE PREPPED FOR SURGERY.  ONLY 1 SUPPORT PERSON MAY BE PRESENT IN THE WAITING  ROOM WHILE YOU ARE IN SURGERY.  IF YOU ARE TO BE ADMITTED, ONCE YOU ARE IN YOUR ROOM YOU WILL BE ALLOWED TWO (2) VISITORS. 1 (ONE) VISITOR MAY STAY OVERNIGHT BUT MUST ARRIVE TO THE ROOM BY 8pm.  Minor children may have two parents present. Special consideration for safety and communication needs will be reviewed on a case by case basis.  Special instructions:    Oral Hygiene is also important to reduce your risk of infection.  Remember - BRUSH YOUR TEETH THE MORNING OF SURGERY WITH YOUR REGULAR TOOTHPASTE   [place]- Preparing For Surgery  Before surgery, you can play an important role. Because skin is not sterile, your skin needs to be as free of germs as possible. You can reduce the number of germs on your skin by washing with CHG (chlorahexidine gluconate)  Soap before surgery.  CHG is an antiseptic cleaner which kills germs and bonds with the skin to continue killing germs even after washing.     Please do not use if you have an allergy to CHG or antibacterial soaps. If your skin becomes reddened/irritated stop using the CHG.  Do not shave (including legs and underarms) for at least 48 hours prior to first CHG shower. It is OK to shave your face.  Please follow these instructions carefully.     Shower the NIGHT BEFORE SURGERY and the MORNING OF SURGERY with CHG Soap.   If you chose to wash your hair, wash your hair first as usual with your normal shampoo. After you shampoo, rinse your hair and body thoroughly to remove the shampoo.  Then [employer] and genitals (private parts) with your normal soap and rinse thoroughly to remove soap.  After that Use CHG Soap as you would any other liquid soap. You can apply CHG directly to the skin and wash gently with a scrungie or a clean washcloth.   Apply the CHG Soap to your body ONLY FROM THE NECK DOWN.  Do not use on open wounds or open sores. Avoid contact with your eyes, ears, mouth and genitals (private parts). Wash Face and genitals (private  parts)  with your normal soap.   Wash thoroughly, paying special attention to the area where your surgery will be performed.  Thoroughly rinse your body with warm water from the neck down.  DO NOT shower/wash with your normal soap after using and rinsing off the CHG Soap.  Pat yourself dry with a CLEAN TOWEL.  Wear CLEAN PAJAMAS to bed the night before surgery  Place CLEAN SHEETS on your bed the night before your surgery  DO NOT SLEEP WITH PETS.   Day of Surgery: Take a shower with CHG soap. Wear Clean/Comfortable clothing the morning of surgery Do not apply any deodorants/lotions.   Remember to brush your teeth WITH YOUR REGULAR TOOTHPASTE.   Please read over the following fact sheets that you were given.

## 2021-11-06 ENCOUNTER — Other Ambulatory Visit: Payer: Self-pay

## 2021-11-06 ENCOUNTER — Encounter (HOSPITAL_COMMUNITY): Payer: Self-pay

## 2021-11-06 ENCOUNTER — Encounter (HOSPITAL_COMMUNITY)
Admission: RE | Admit: 2021-11-06 | Discharge: 2021-11-06 | Disposition: A | Discharge status: Home / Self Care | Payer: PRIVATE HEALTH INSURANCE | Source: Ambulatory Visit | Attending: Orthopaedic Surgery | Admitting: Orthopaedic Surgery

## 2021-11-06 VITALS — BP 148/98 | HR 104 | Temp 98.6°F | Resp 18 | Ht 69.0 in | Wt 203.0 lb

## 2021-11-06 DIAGNOSIS — E119 Type 2 diabetes mellitus without complications: Secondary | ICD-10-CM | POA: Insufficient documentation

## 2021-11-06 DIAGNOSIS — M87051 Idiopathic aseptic necrosis of right femur: Secondary | ICD-10-CM

## 2021-11-06 DIAGNOSIS — Z01818 Encounter for other preprocedural examination: Secondary | ICD-10-CM | POA: Insufficient documentation

## 2021-11-06 DIAGNOSIS — M87851 Other osteonecrosis, right femur: Secondary | ICD-10-CM | POA: Insufficient documentation

## 2021-11-06 DIAGNOSIS — I10 Essential (primary) hypertension: Secondary | ICD-10-CM | POA: Diagnosis not present

## 2021-11-06 DIAGNOSIS — Z20822 Contact with and (suspected) exposure to covid-19: Secondary | ICD-10-CM | POA: Insufficient documentation

## 2021-11-06 LAB — BASIC METABOLIC PANEL
Anion gap: 10 (ref 5–15)
BUN: 14 mg/dL (ref 6–20)
CO2: 27 mmol/L (ref 22–32)
Calcium: 11 mg/dL — ABNORMAL HIGH (ref 8.9–10.3)
Chloride: 102 mmol/L (ref 98–111)
Creatinine, Ser: 1.1 mg/dL — ABNORMAL HIGH (ref 0.44–1.00)
GFR, Estimated: >60 mL/min (ref >60)
Glucose, Bld: 77 mg/dL (ref 70–99)
Potassium: 3.3 mmol/L — ABNORMAL LOW (ref 3.5–5.1)
Sodium: 139 mmol/L (ref 135–145)

## 2021-11-06 LAB — CBC
HCT: 36.7 % (ref 36.0–46.0)
Hemoglobin: 11.6 g/dL — ABNORMAL LOW (ref 12.0–15.0)
MCH: 24.9 pg — ABNORMAL LOW (ref 26.0–34.0)
MCHC: 31.6 g/dL (ref 30.0–36.0)
MCV: 78.9 fL — ABNORMAL LOW (ref 80.0–100.0)
Platelets: 478 10*3/uL — ABNORMAL HIGH (ref 150–400)
RBC: 4.65 MIL/uL (ref 3.87–5.11)
RDW: 15 % (ref 11.5–15.5)
WBC: 9.3 10*3/uL (ref 4.0–10.5)
nRBC: 0 % (ref 0.0–0.2)

## 2021-11-06 LAB — SURGICAL PCR SCREEN
MRSA, PCR: NEGATIVE
Staphylococcus aureus: NEGATIVE

## 2021-11-06 LAB — GLUCOSE, CAPILLARY: Glucose-Capillary: 83 mg/dL (ref 70–99)

## 2021-11-06 LAB — TYPE AND SCREEN
ABO/RH(D): B POS
Antibody Screen: NEGATIVE

## 2021-11-06 LAB — HEMOGLOBIN A1C
Hgb A1c MFr Bld: 6.1 % — ABNORMAL HIGH (ref 4.8–5.6)
Mean Plasma Glucose: 128.37 mg/dL

## 2021-11-06 NOTE — Progress Notes (Signed)
PCP - [facility]) Cardiologist - denies  PPM/ICD - denies   Chest x-ray - n/a EKG - 11/06/21 Stress Test - denies ECHO - denies Cardiac Cath - denies  Sleep Study - denies -OSA   Fasting Blood Sugar - 80-90 Checks Blood Sugar twice a day   As of today, STOP taking any Aspirin (unless otherwise instructed by your surgeon) Aleve, Naproxen, Ibuprofen, Motrin, Advil, Goody's, BC's, all herbal medications, fish oil, and all vitamins.  ERAS Protcol -yes PRE-SURGERY Ensure or G2- gatorade g2 ordered and give  COVID TEST- 11/06/21 in PAT   Anesthesia review: no  Patient denies shortness of breath, fever, cough and chest pain at PAT appointment   All instructions explained to the patient, with a verbal understanding of the material. Patient agrees to go over the instructions while at home for a better understanding. Patient also instructed to self quarantine after being tested for COVID-19. The opportunity to ask questions was provided.

## 2021-11-07 LAB — SARS CORONAVIRUS 2 (TAT 6-24 HRS): SARS Coronavirus 2: NEGATIVE

## 2021-11-10 ENCOUNTER — Encounter (HOSPITAL_COMMUNITY)
Admission: RE | Disposition: A | Discharge status: Home / Self Care | Payer: Self-pay | Source: Home / Self Care | Attending: Orthopaedic Surgery

## 2021-11-10 ENCOUNTER — Ambulatory Visit (HOSPITAL_COMMUNITY): Payer: PRIVATE HEALTH INSURANCE

## 2021-11-10 ENCOUNTER — Observation Stay (HOSPITAL_COMMUNITY)
Admission: RE | Admit: 2021-11-10 | Discharge: 2021-11-11 | Disposition: A | Discharge status: Home / Self Care | Payer: PRIVATE HEALTH INSURANCE | Attending: Orthopaedic Surgery | Admitting: Orthopaedic Surgery

## 2021-11-10 ENCOUNTER — Ambulatory Visit (HOSPITAL_COMMUNITY): Payer: PRIVATE HEALTH INSURANCE | Admitting: Anesthesiology

## 2021-11-10 ENCOUNTER — Observation Stay (HOSPITAL_COMMUNITY): Payer: PRIVATE HEALTH INSURANCE

## 2021-11-10 ENCOUNTER — Ambulatory Visit (HOSPITAL_COMMUNITY): Payer: PRIVATE HEALTH INSURANCE | Admitting: Physician Assistant

## 2021-11-10 ENCOUNTER — Encounter (HOSPITAL_COMMUNITY): Payer: Self-pay | Admitting: Orthopaedic Surgery

## 2021-11-10 DIAGNOSIS — M87051 Idiopathic aseptic necrosis of right femur: Principal | ICD-10-CM | POA: Insufficient documentation

## 2021-11-10 DIAGNOSIS — E119 Type 2 diabetes mellitus without complications: Secondary | ICD-10-CM | POA: Diagnosis not present

## 2021-11-10 DIAGNOSIS — Z419 Encounter for procedure for purposes other than remedying health state, unspecified: Secondary | ICD-10-CM

## 2021-11-10 DIAGNOSIS — I10 Essential (primary) hypertension: Secondary | ICD-10-CM | POA: Diagnosis not present

## 2021-11-10 DIAGNOSIS — Z96641 Presence of right artificial hip joint: Secondary | ICD-10-CM

## 2021-11-10 HISTORY — PX: TOTAL HIP ARTHROPLASTY: SHX124

## 2021-11-10 LAB — GLUCOSE, CAPILLARY
Glucose-Capillary: 111 mg/dL — ABNORMAL HIGH (ref 70–99)
Glucose-Capillary: 65 mg/dL — ABNORMAL LOW (ref 70–99)
Glucose-Capillary: 111 mg/dL — ABNORMAL HIGH (ref 70–99)
Glucose-Capillary: 61 mg/dL — ABNORMAL LOW (ref 70–99)
Glucose-Capillary: 145 mg/dL — ABNORMAL HIGH (ref 70–99)
Glucose-Capillary: 90 mg/dL (ref 70–99)

## 2021-11-10 LAB — ABO/RH: ABO/RH(D): B POS

## 2021-11-10 SURGERY — ARTHROPLASTY, HIP, TOTAL, ANTERIOR APPROACH
Anesthesia: Spinal | Site: Hip | Laterality: Right

## 2021-11-10 MED ORDER — OXYCODONE HCL 5 MG/5ML PO SOLN: 5.0000 mg | Freq: Once | ORAL | Status: DC | PRN

## 2021-11-10 MED ORDER — AMLODIPINE BESYLATE 5 MG PO TABS
10.0000 mg | ORAL_TABLET | Freq: Every day | ORAL | Status: DC
  Administered 2021-11-11: 10 mg via ORAL
  Filled 2021-11-10 (×2): qty 2

## 2021-11-10 MED ORDER — OXYCODONE HCL 5 MG PO TABS
10.0000 mg | ORAL_TABLET | ORAL | Status: DC | PRN
  Administered 2021-11-10: 15 mg via ORAL
  Administered 2021-11-10: 10 mg via ORAL
  Administered 2021-11-11: 15 mg via ORAL
  Administered 2021-11-11: 10 mg via ORAL
  Filled 2021-11-10 (×2): qty 3

## 2021-11-10 MED ORDER — ONDANSETRON HCL 4 MG PO TABS: 4.0000 mg | ORAL_TABLET | Freq: Four times a day (QID) | ORAL | Status: DC | PRN

## 2021-11-10 MED ORDER — LACTATED RINGERS IV SOLN
INTRAVENOUS | Status: DC | PRN
Start: 2021-11-10 — End: 2021-11-10

## 2021-11-10 MED ORDER — CEFAZOLIN SODIUM-DEXTROSE 2-4 GM/100ML-% IV SOLN
2.0000 g | INTRAVENOUS | Status: AC
  Administered 2021-11-10: 2 g via INTRAVENOUS
  Filled 2021-11-10: qty 100

## 2021-11-10 MED ORDER — PROMETHAZINE HCL 25 MG/ML IJ SOLN: 6.2500 mg | INTRAMUSCULAR | Status: DC | PRN

## 2021-11-10 MED ORDER — DEXTROSE 50 % IV SOLN
25.0000 mL | Freq: Once | INTRAVENOUS | Status: AC
  Administered 2021-11-10: 25 mL via INTRAVENOUS

## 2021-11-10 MED ORDER — POVIDONE-IODINE 10 % EX SWAB: 2.0000 "application " | Freq: Once | CUTANEOUS | Status: DC

## 2021-11-10 MED ORDER — HYDROMORPHONE HCL 1 MG/ML IJ SOLN
0.5000 mg | INTRAMUSCULAR | Status: DC | PRN
  Administered 2021-11-10: 1 mg via INTRAVENOUS
  Filled 2021-11-10: qty 1

## 2021-11-10 MED ORDER — LIDOCAINE 2% (20 MG/ML) 5 ML SYRINGE
INTRAMUSCULAR | Status: AC
  Filled 2021-11-10: qty 5

## 2021-11-10 MED ORDER — PROPOFOL 500 MG/50ML IV EMUL
INTRAVENOUS | Status: DC | PRN
Start: 2021-11-10 — End: 2021-11-10
  Administered 2021-11-10: 80 ug/kg/min via INTRAVENOUS

## 2021-11-10 MED ORDER — ASPIRIN 81 MG PO CHEW
81.0000 mg | CHEWABLE_TABLET | Freq: Two times a day (BID) | ORAL | Status: DC
  Administered 2021-11-10 – 2021-11-11 (×2): 81 mg via ORAL
  Filled 2021-11-10 (×2): qty 1

## 2021-11-10 MED ORDER — ORAL CARE MOUTH RINSE: 15.0000 mL | Freq: Once | OROMUCOSAL | Status: AC

## 2021-11-10 MED ORDER — ONDANSETRON HCL 4 MG/2ML IJ SOLN
INTRAMUSCULAR | Status: AC
  Filled 2021-11-10: qty 2

## 2021-11-10 MED ORDER — MENTHOL 3 MG MT LOZG: 1.0000 | LOZENGE | OROMUCOSAL | Status: DC | PRN

## 2021-11-10 MED ORDER — MIDAZOLAM HCL 2 MG/2ML IJ SOLN
INTRAMUSCULAR | Status: DC | PRN
  Administered 2021-11-10: 2 mg via INTRAVENOUS

## 2021-11-10 MED ORDER — METOCLOPRAMIDE HCL 5 MG/ML IJ SOLN: 5.0000 mg | Freq: Three times a day (TID) | INTRAMUSCULAR | Status: DC | PRN

## 2021-11-10 MED ORDER — DEXTROSE 50 % IV SOLN
INTRAVENOUS | Status: AC
  Filled 2021-11-10: qty 50

## 2021-11-10 MED ORDER — INSULIN ASPART 100 UNIT/ML IJ SOLN: 0.0000 [IU] | Freq: Every day | INTRAMUSCULAR | Status: DC

## 2021-11-10 MED ORDER — FENOFIBRATE 160 MG PO TABS
160.0000 mg | ORAL_TABLET | Freq: Every day | ORAL | Status: DC
  Administered 2021-11-11: 160 mg via ORAL
  Filled 2021-11-10: qty 1

## 2021-11-10 MED ORDER — PANTOPRAZOLE SODIUM 40 MG PO TBEC
40.0000 mg | DELAYED_RELEASE_TABLET | Freq: Every day | ORAL | Status: DC
  Administered 2021-11-10 – 2021-11-11 (×2): 40 mg via ORAL
  Filled 2021-11-10 (×2): qty 1

## 2021-11-10 MED ORDER — ONDANSETRON HCL 4 MG/2ML IJ SOLN: 4.0000 mg | Freq: Four times a day (QID) | INTRAMUSCULAR | Status: DC | PRN

## 2021-11-10 MED ORDER — ACETAMINOPHEN 10 MG/ML IV SOLN: 1000.0000 mg | Freq: Once | INTRAVENOUS | Status: DC | PRN

## 2021-11-10 MED ORDER — GLIPIZIDE 5 MG PO TABS
5.0000 mg | ORAL_TABLET | Freq: Every day | ORAL | Status: DC
  Administered 2021-11-10 – 2021-11-11 (×2): 5 mg via ORAL
  Filled 2021-11-10 (×2): qty 1

## 2021-11-10 MED ORDER — OXYCODONE HCL 5 MG PO TABS
5.0000 mg | ORAL_TABLET | ORAL | Status: DC | PRN
  Administered 2021-11-11: 10 mg via ORAL
  Filled 2021-11-10 (×3): qty 2

## 2021-11-10 MED ORDER — DIPHENHYDRAMINE HCL 12.5 MG/5ML PO ELIX
12.5000 mg | ORAL_SOLUTION | ORAL | Status: DC | PRN
  Filled 2021-11-10: qty 10

## 2021-11-10 MED ORDER — PROPOFOL 10 MG/ML IV BOLUS
INTRAVENOUS | Status: DC | PRN
  Administered 2021-11-10: 15 mg via INTRAVENOUS

## 2021-11-10 MED ORDER — FENTANYL CITRATE (PF) 100 MCG/2ML IJ SOLN
INTRAMUSCULAR | Status: AC
  Filled 2021-11-10: qty 2

## 2021-11-10 MED ORDER — BUPIVACAINE IN DEXTROSE 0.75-8.25 % IT SOLN
INTRATHECAL | Status: DC | PRN
  Administered 2021-11-10: 1.9 mL via INTRATHECAL

## 2021-11-10 MED ORDER — LACTATED RINGERS IV SOLN: INTRAVENOUS | Status: DC

## 2021-11-10 MED ORDER — CHLORHEXIDINE GLUCONATE 0.12 % MT SOLN
15.0000 mL | Freq: Once | OROMUCOSAL | Status: AC
  Administered 2021-11-10: 15 mL via OROMUCOSAL
  Filled 2021-11-10: qty 15

## 2021-11-10 MED ORDER — SODIUM CHLORIDE 0.9 % IV SOLN: INTRAVENOUS | Status: DC

## 2021-11-10 MED ORDER — TIZANIDINE HCL 4 MG PO TABS
4.0000 mg | ORAL_TABLET | Freq: Four times a day (QID) | ORAL | Status: DC | PRN
  Administered 2021-11-10 – 2021-11-11 (×3): 4 mg via ORAL
  Filled 2021-11-10 (×3): qty 1

## 2021-11-10 MED ORDER — HYDROCHLOROTHIAZIDE 25 MG PO TABS
25.0000 mg | ORAL_TABLET | Freq: Every day | ORAL | Status: DC
  Administered 2021-11-10 – 2021-11-11 (×2): 25 mg via ORAL
  Filled 2021-11-10 (×2): qty 1

## 2021-11-10 MED ORDER — AMISULPRIDE (ANTIEMETIC) 5 MG/2ML IV SOLN: 10.0000 mg | Freq: Once | INTRAVENOUS | Status: DC | PRN

## 2021-11-10 MED ORDER — FENTANYL CITRATE (PF) 250 MCG/5ML IJ SOLN
INTRAMUSCULAR | Status: DC | PRN
  Administered 2021-11-10 (×5): 50 ug via INTRAVENOUS

## 2021-11-10 MED ORDER — SODIUM CHLORIDE 0.9 % IR SOLN
Status: DC | PRN
  Administered 2021-11-10: 1000 mL

## 2021-11-10 MED ORDER — ONDANSETRON HCL 4 MG/2ML IJ SOLN
INTRAMUSCULAR | Status: DC | PRN
  Administered 2021-11-10: 4 mg via INTRAVENOUS

## 2021-11-10 MED ORDER — MIDAZOLAM HCL 2 MG/2ML IJ SOLN
INTRAMUSCULAR | Status: AC
  Filled 2021-11-10: qty 2

## 2021-11-10 MED ORDER — CEFAZOLIN SODIUM-DEXTROSE 1-4 GM/50ML-% IV SOLN
1.0000 g | Freq: Four times a day (QID) | INTRAVENOUS | Status: AC
  Administered 2021-11-10 (×2): 1 g via INTRAVENOUS
  Filled 2021-11-10 (×2): qty 50

## 2021-11-10 MED ORDER — INSULIN ASPART 100 UNIT/ML IJ SOLN
0.0000 [IU] | Freq: Three times a day (TID) | INTRAMUSCULAR | Status: DC
  Administered 2021-11-10: 2 [IU] via SUBCUTANEOUS

## 2021-11-10 MED ORDER — TRANEXAMIC ACID-NACL 1000-0.7 MG/100ML-% IV SOLN
1000.0000 mg | INTRAVENOUS | Status: AC
  Administered 2021-11-10: 1000 mg via INTRAVENOUS
  Filled 2021-11-10: qty 100

## 2021-11-10 MED ORDER — FENTANYL CITRATE (PF) 100 MCG/2ML IJ SOLN
25.0000 ug | INTRAMUSCULAR | Status: DC | PRN
  Administered 2021-11-10 (×2): 25 ug via INTRAVENOUS

## 2021-11-10 MED ORDER — FENTANYL CITRATE (PF) 250 MCG/5ML IJ SOLN
INTRAMUSCULAR | Status: AC
  Filled 2021-11-10: qty 5

## 2021-11-10 MED ORDER — DOCUSATE SODIUM 100 MG PO CAPS
100.0000 mg | ORAL_CAPSULE | Freq: Two times a day (BID) | ORAL | Status: DC
  Administered 2021-11-10 – 2021-11-11 (×3): 100 mg via ORAL
  Filled 2021-11-10 (×3): qty 1

## 2021-11-10 MED ORDER — OXYCODONE HCL 5 MG PO TABS: 5.0000 mg | ORAL_TABLET | Freq: Once | ORAL | Status: DC | PRN

## 2021-11-10 MED ORDER — PHENYLEPHRINE HCL-NACL 20-0.9 MG/250ML-% IV SOLN
INTRAVENOUS | Status: DC | PRN
  Administered 2021-11-10: 50 ug/min via INTRAVENOUS

## 2021-11-10 MED ORDER — 0.9 % SODIUM CHLORIDE (POUR BTL) OPTIME
TOPICAL | Status: DC | PRN
  Administered 2021-11-10: 1000 mL

## 2021-11-10 MED ORDER — LIDOCAINE 2% (20 MG/ML) 5 ML SYRINGE
INTRAMUSCULAR | Status: DC | PRN
  Administered 2021-11-10: 50 mg via INTRAVENOUS

## 2021-11-10 MED ORDER — METOCLOPRAMIDE HCL 5 MG PO TABS: 5.0000 mg | ORAL_TABLET | Freq: Three times a day (TID) | ORAL | Status: DC | PRN

## 2021-11-10 MED ORDER — ADULT MULTIVITAMIN W/MINERALS CH
1.0000 | ORAL_TABLET | Freq: Every day | ORAL | Status: DC
  Administered 2021-11-11: 1 via ORAL
  Filled 2021-11-10 (×2): qty 1

## 2021-11-10 MED ORDER — ALUM & MAG HYDROXIDE-SIMETH 200-200-20 MG/5ML PO SUSP: 30.0000 mL | ORAL | Status: DC | PRN

## 2021-11-10 MED ORDER — ACETAMINOPHEN 325 MG PO TABS
325.0000 mg | ORAL_TABLET | Freq: Four times a day (QID) | ORAL | Status: DC | PRN
  Administered 2021-11-11: 650 mg via ORAL
  Filled 2021-11-10: qty 2

## 2021-11-10 MED ORDER — PHENOL 1.4 % MT LIQD: 1.0000 | OROMUCOSAL | Status: DC | PRN

## 2021-11-10 SURGICAL SUPPLY — 55 items
BAG COUNTER SPONGE SURGICOUNT (BAG) ×2 IMPLANT
BALL HIP CERAMIC (Hips) IMPLANT
BENZOIN TINCTURE PRP APPL 2/3 (GAUZE/BANDAGES/DRESSINGS) ×2 IMPLANT
BLADE CLIPPER SURG (BLADE) IMPLANT
BLADE SAW SGTL 18X1.27X75 (BLADE) ×2 IMPLANT
COVER SURGICAL LIGHT HANDLE (MISCELLANEOUS) ×2 IMPLANT
CUP SECTOR GRIPTON 50MM (Cup) ×1 IMPLANT
DRAPE C-ARM 42X72 X-RAY (DRAPES) ×2 IMPLANT
DRAPE STERI IOBAN 125X83 (DRAPES) ×2 IMPLANT
DRAPE U-SHAPE 47X51 STRL (DRAPES) ×6 IMPLANT
DRSG AQUACEL AG ADV 3.5X10 (GAUZE/BANDAGES/DRESSINGS) ×2 IMPLANT
DURAPREP 26ML APPLICATOR (WOUND CARE) ×2 IMPLANT
ELECT BLADE 4.0 EZ CLEAN MEGAD (MISCELLANEOUS) ×2
ELECT BLADE 6.5 EXT (BLADE) IMPLANT
ELECT REM PT RETURN 9FT ADLT (ELECTROSURGICAL) ×2
ELECTRODE BLDE 4.0 EZ CLN MEGD (MISCELLANEOUS) ×1 IMPLANT
ELECTRODE REM PT RTRN 9FT ADLT (ELECTROSURGICAL) ×1 IMPLANT
FACESHIELD WRAPAROUND (MASK) ×4 IMPLANT
FACESHIELD WRAPAROUND OR TEAM (MASK) ×2 IMPLANT
GLOVE SRG 8 PF TXTR STRL LF DI (GLOVE) ×2 IMPLANT
GLOVE SURG LTX SZ8 (GLOVE) ×2 IMPLANT
GLOVE SURG ORTHO LTX SZ7.5 (GLOVE) ×4 IMPLANT
GLOVE SURG UNDER POLY LF SZ8 (GLOVE) ×4
GOWN STRL REUS W/ TWL LRG LVL3 (GOWN DISPOSABLE) ×2 IMPLANT
GOWN STRL REUS W/ TWL XL LVL3 (GOWN DISPOSABLE) ×2 IMPLANT
GOWN STRL REUS W/TWL LRG LVL3 (GOWN DISPOSABLE) ×2
GOWN STRL REUS W/TWL XL LVL3 (GOWN DISPOSABLE) ×4
HANDPIECE INTERPULSE COAX TIP (DISPOSABLE) ×2
HIP BALL CERAMIC (Hips) ×2 IMPLANT
KIT BASIN OR (CUSTOM PROCEDURE TRAY) ×2 IMPLANT
KIT TURNOVER KIT B (KITS) ×2 IMPLANT
LINER ACET PNNCL PLUS4 NEUTRAL (Hips) IMPLANT
MANIFOLD NEPTUNE II (INSTRUMENTS) ×2 IMPLANT
NS IRRIG 1000ML POUR BTL (IV SOLUTION) ×2 IMPLANT
PACK TOTAL JOINT (CUSTOM PROCEDURE TRAY) ×2 IMPLANT
PAD ARMBOARD 7.5X6 YLW CONV (MISCELLANEOUS) ×2 IMPLANT
PINNACLE PLUS 4 NEUTRAL (Hips) ×2 IMPLANT
SET HNDPC FAN SPRY TIP SCT (DISPOSABLE) ×1 IMPLANT
STAPLER VISISTAT 35W (STAPLE) IMPLANT
STEM FEM ACTIS HIGH SZ2 (Stem) ×1 IMPLANT
STRIP CLOSURE SKIN 1/2X4 (GAUZE/BANDAGES/DRESSINGS) ×4 IMPLANT
SUT ETHIBOND NAB CT1 #1 30IN (SUTURE) ×2 IMPLANT
SUT MNCRL AB 4-0 PS2 18 (SUTURE) IMPLANT
SUT VIC AB 0 CT1 27 (SUTURE) ×2
SUT VIC AB 0 CT1 27XBRD ANBCTR (SUTURE) ×1 IMPLANT
SUT VIC AB 1 CT1 27 (SUTURE) ×1
SUT VIC AB 1 CT1 27XBRD ANBCTR (SUTURE) ×1 IMPLANT
SUT VIC AB 2-0 CT1 27 (SUTURE) ×2
SUT VIC AB 2-0 CT1 TAPERPNT 27 (SUTURE) ×1 IMPLANT
TOWEL GREEN STERILE (TOWEL DISPOSABLE) ×2 IMPLANT
TOWEL GREEN STERILE FF (TOWEL DISPOSABLE) ×2 IMPLANT
TRAY CATH 16FR W/PLASTIC CATH (SET/KITS/TRAYS/PACK) IMPLANT
TRAY FOLEY W/BAG SLVR 16FR (SET/KITS/TRAYS/PACK)
TRAY FOLEY W/BAG SLVR 16FR ST (SET/KITS/TRAYS/PACK) IMPLANT
WATER STERILE IRR 1000ML POUR (IV SOLUTION) ×4 IMPLANT

## 2021-11-10 NOTE — Evaluation (Addendum)
Physical Therapy Evaluation Patient Details Name: Tracy Kelley MRN: [id number] DOB: [birth year] Today's Date: 11/10/2021  History of Present Illness  Pt is a 51 y/o female s/p R THA, direct anterior. PMH includes HTN and DM.  Clinical Impression  Pt is s/p surgery above with deficits below. Pt requiring min A for bed mobility tasks. Once sitting, pt with increased nausea and dizziness, so unable to tolerate further mobility. Anticipate pt will progress well once pain controlled. Will continue to follow acutely.         Recommendations for follow up therapy are one component of a multi-disciplinary discharge planning process, led by the attending physician.  Recommendations may be updated based on patient status, additional functional criteria and insurance authorization.  Follow Up Recommendations Follow physician's recommendations for discharge plan and follow up therapies    Assistance Recommended at Discharge Intermittent Supervision/Assistance  Patient can return home with the following       Equipment Recommendations BSC/3in1 (Reports aunt has RW)  Recommendations for Other Services       Functional Status Assessment Patient has had a recent decline in their functional status and demonstrates the ability to make significant improvements in function in a reasonable and predictable amount of time.     Precautions / Restrictions Precautions Precautions: Fall Restrictions Weight Bearing Restrictions: Yes RLE Weight Bearing: Weight bearing as tolerated      Mobility  Bed Mobility Overal bed mobility: Needs Assistance Bed Mobility: Supine to Sit, Sit to Supine     Supine to sit: Min assist Sit to supine: Min assist   General bed mobility comments: Required assist for RLE management. Once sitting, pt became very nauseous and dizzy and symptoms continued to worsen, so returned to supine.    Transfers                        Ambulation/Gait                   Stairs            Wheelchair Mobility    Modified Rankin (Stroke Patients Only)       Balance Overall balance assessment: Needs assistance Sitting-balance support: No upper extremity supported, Feet supported Sitting balance-Leahy Scale: Fair                                       Pertinent Vitals/Pain Pain Assessment Pain Assessment: Faces Faces Pain Scale: Hurts even more Pain Location: R hip Pain Descriptors / Indicators: Grimacing, Guarding Pain Intervention(s): Limited activity within patient's tolerance, Monitored during session, Repositioned    Home Living Family/patient expects to be discharged to:: Private residence Living Arrangements: Children Available Help at Discharge: Family Type of Home: House Home Access: Level entry       Home Layout: One level Home Equipment: Kasandra Knudsen - single point      Prior Function Prior Level of Function : Independent/Modified Independent             Mobility Comments: Has been using cane for ambulation       Hand Dominance        Extremity/Trunk Assessment   Upper Extremity Assessment Upper Extremity Assessment: Overall WFL for tasks assessed    Lower Extremity Assessment Lower Extremity Assessment: RLE deficits/detail RLE Deficits / Details: Deficits consistent with post op pain and weakness.    Cervical /  Trunk Assessment Cervical / Trunk Assessment: Normal  Communication   Communication: No difficulties  Cognition Arousal/Alertness: Awake/alert Behavior During Therapy: WFL for tasks assessed/performed Overall Cognitive Status: Within Functional Limits for tasks assessed                                          General Comments General comments (skin integrity, edema, etc.): Pt's aunt present during session    Exercises     Assessment/Plan    PT Assessment Patient needs continued PT services  PT Problem List Decreased strength;Decreased activity  tolerance;Decreased balance;Decreased mobility;Decreased range of motion;Decreased knowledge of use of DME;Pain       PT Treatment Interventions DME instruction;Gait training;Stair training;Therapeutic activities;Therapeutic exercise;Balance training;Patient/family education    PT Goals (Current goals can be found in the Care Plan section)  Acute Rehab PT Goals Patient Stated Goal: to decrease pain PT Goal Formulation: With patient Time For Goal Achievement: 11/24/21 Potential to Achieve Goals: Good    Frequency 7X/week     Co-evaluation               AM-PAC PT "6 Clicks" Mobility  Outcome Measure Help needed turning from your back to your side while in a flat bed without using bedrails?: A Little Help needed moving from lying on your back to sitting on the side of a flat bed without using bedrails?: A Little Help needed moving to and from a bed to a chair (including a wheelchair)?: A Little Help needed standing up from a chair using your arms (e.g., wheelchair or bedside chair)?: A Little Help needed to walk in hospital room?: A Little Help needed climbing 3-5 steps with a railing? : A Lot 6 Click Score: 17    End of Session Equipment Utilized During Treatment: Gait belt Activity Tolerance: Treatment limited secondary to medical complications (Comment) (dizziness, nausea) Patient left: in bed;with call bell/phone within reach;with family/visitor present Nurse Communication: Mobility status PT Visit Diagnosis: Other abnormalities of gait and mobility (R26.89);Pain Pain - Right/Left: Right Pain - part of body: Hip    Time: 8099-8338 PT Time Calculation (min) (ACUTE ONLY): 16 min   Charges:   PT Evaluation $PT Eval Low Complexity: 1 Low          Lou Miner, DPT  Acute Rehabilitation Services  Pager: [phone] Office: [phone]   Rudean Hitt 11/10/2021, 3:12 PM

## 2021-11-10 NOTE — Transfer of Care (Signed)
Immediate Anesthesia Transfer of Care Note  Patient: Tracy Kelley  Procedure(s) Performed: RIGHT TOTAL HIP ARTHROPLASTY ANTERIOR APPROACH (Right: Hip)  Patient Location: PACU  Anesthesia Type:MAC and Spinal  Level of Consciousness: drowsy, patient cooperative and responds to stimulation  Airway & Oxygen Therapy: Patient Spontanous Breathing and Patient connected to nasal cannula oxygen  Post-op Assessment: Report given to RN and Post -op Vital signs reviewed and stable  Post vital signs: Reviewed and stable  Last Vitals:  Vitals Value Taken Time  BP    Temp    Pulse 90 11/10/21 1116  Resp 15 11/10/21 1116  SpO2 99 % 11/10/21 1116  Vitals shown include unvalidated device data.  Last Pain:  Vitals:   11/10/21 0809  TempSrc:   PainSc: 3       Patients Stated Pain Goal: 3 (78/41/28 2081)  Complications: No notable events documented.

## 2021-11-10 NOTE — Progress Notes (Signed)
CBG 65, MD Ellender notified. 8 oz juice administered. Will recheck CBG in 15 minutes.

## 2021-11-10 NOTE — Brief Op Note (Signed)
11/10/2021  11:04 AM  PATIENT:  Tracy Kelley  51 y.o. female  PRE-OPERATIVE DIAGNOSIS:  avascular necrosis right hip  POST-OPERATIVE DIAGNOSIS:  avascular necrosis right hip  PROCEDURE:  Procedure(s): RIGHT TOTAL HIP ARTHROPLASTY ANTERIOR APPROACH (Right)  SURGEON:  Surgeon(s) and Role:    [name], MD - Primary  PHYSICIAN ASSISTANT: Benita Stabile, PA-C  ANESTHESIA:   spinal  EBL:  200 mL   COUNTS:  YES  DICTATION: .Other Dictation: Dictation Number [id number]  PLAN OF CARE: Admit for overnight observation  PATIENT DISPOSITION:  PACU - hemodynamically stable.   Delay start of Pharmacological VTE agent (>24hrs) due to surgical blood loss or risk of bleeding: no

## 2021-11-10 NOTE — Progress Notes (Signed)
Inpatient Diabetes Program Recommendations  AACE/ADA: New Consensus Statement on Inpatient Glycemic Control   Target Ranges:  Prepandial:   less than 140 mg/dL      Peak postprandial:   less than 180 mg/dL (1-2 hours)      Critically ill patients:  140 - 180 mg/dL    Latest Reference Range & Units 11/10/21 08:06 11/10/21 11:22 11/10/21 11:40  Glucose-Capillary 70 - 99 mg/dL 111 (H) 65 (L) 61 (L)    Review of Glycemic Control  Diabetes history: DM2 Outpatient Diabetes medications: Glipizide 5 mg daily Current orders for Inpatient glycemic control: Novolog 0-15 units TID with meals, Novolog 0-5 units QHS, Glipizide 5 mg daily  Inpatient Diabetes Program Recommendations:    DM medication: Please consider discontinuing Glipizide 5 mg daily while inpatient.  Thanks, Barnie Alderman, RN, MSN, CDE Diabetes Coordinator Inpatient Diabetes Program [phone] (Team Pager from 8am to 5pm)

## 2021-11-10 NOTE — Op Note (Signed)
Tracy Kelley, Tracy Kelley MEDICAL RECORD NO: [id number] ACCOUNT NO: [id number] DATE OF BIRTH: 08-16-1971 FACILITY: [facility] LOCATION: [facility] PHYSICIAN: Lind Guest. Ninfa Linden, MD  Operative Report   DATE OF PROCEDURE: 11/10/2021   PREOPERATIVE DIAGNOSIS:  End-stage avascular necrosis, right hip.  POSTOPERATIVE DIAGNOSIS:  End-stage avascular necrosis, right hip.  PROCEDURE:  Right total hip arthroplasty through direct anterior approach.  IMPLANTS:  DePuy sector Gription acetabular component size 50, size 32+4 neutral polyethylene liner, size 2 ACTIS femoral component with high offset, size 32+5 ceramic hip ball.  SURGEON:  Lind Guest. Ninfa Linden, MD  ASSISTANT:  Erskine Emery, PA-C.  ANESTHESIA:  Spinal.  ANTIBIOTICS:  2 g IV Ancef.  ESTIMATED BLOOD LOSS:  701 mL  COMPLICATIONS:  None.  INDICATIONS:  The patient is a 51 year old female who unfortunately has developed avascular necrosis of her right hip.  This is come on rapidly and her pain is severe.  She is already developing femoral head collapse with a right hip.  She has a leg  length discrepancy as well now with the right side being shorter than left.  She is ambulating with a cane.  Her pain is severe and daily and it is 10/10.  We have recommended total hip arthroplasty.  This is based on her radiographic and clinical  findings.  She agrees with this too.  Given the fact that her pain is so severe it is detrimentally affecting her mobility, her quality of life and activities of daily living.  We did talk about the risk of acute blood loss anemia, nerve or vessel  injury, fracture, infection, DVT, implant failure, dislocation, leg length differences and skin and soft tissue issues.  We talked about our goals being decreased pain, improve mobility and overall improve quality of life.  DESCRIPTION OF PROCEDURE:  After informed consent was obtained, appropriate right hip was marked.  She was brought to the operating room and sat up  on the stretcher where spinal anesthesia was obtained.  She was laid in supine position on a stretcher.   Foley catheter was placed and we assessed her leg lengths.  She is definitely shorter on the right than left.  Traction boots were placed on both her feet.  She was placed supine on the Hana fracture table, the perineal post in place and both legs in  line skeletal traction device and no traction applied.  Her right operative hip was prepped and draped with DuraPrep and sterile drapes.  A timeout was called and she was identified correct patient, correct right hip.  I then made an incision just  inferior and posterior to the anterior superior iliac spine and carried this obliquely down the leg.  We dissected down tensor fascia lata muscle.  Tensor fascia was then divided longitudinally to proceed with direct anterior approach to the hip.  We  identified and cauterized circumflex vessels and identified the hip capsule, opened the hip capsule in L-type format finding a large joint effusion and significant synovitis.  We placed Cobra retractors around the medial and lateral femoral neck and made  a femoral neck cut with oscillating saw, proximal to the lesser trochanter and completed this with an osteotome.  We placed a corkscrew guide in the femoral head and removed the femoral head in its entirety and found definitely evidence of femoral head  collapse and osteonecrosis.  We then placed a bent Hohmann over the medial acetabular rim and removed remnants of acetabular labrum and other debris.  We then began reaming under  direct visualization from a size 43 reamer in a stepwise increments going  up to a size 49 reamer with all reamers placed under direct visualization, the last reamer was placed under direct fluoroscopy, so I could obtain my depth of reaming my inclination and anteversion.  I then placed real DePuy sector Gription acetabular  component size 50 and a 32+4 polyethylene liner based on  medialization of the cup.  Attention was then turned to the femur.  With the leg externally rotated to 120 degrees, extended and adducted, we were able to place a Mueller retractor medially and  Hohman retractor behind the greater trochanter, we released lateral joint capsule and used a box cutting osteotome to enter femoral canal and a rongeur to lateralize.  We then began broaching using the ACTIS broaching system from a size 0 going up to a  size 2. With a size 2 in place, we trialed a standard offset femoral neck and a 32+1 hip ball.  We brought the leg back over and up and with traction and internal rotation reduced in the pelvis.  We definitely needed more offset and leg length.  We  dislocated the hip and removed the trial components after assessing it radiographically and clinically.  We then placed the real ACTIS femoral component, size 2 but we went with a high offset component.  In order to increase her leg length a little more  we went with a 32+5 ceramic hip ball.  We brought the leg back over and up again and reduced this in acetabulum.  We were pleased with range of motion, leg length, offset and stability assessed mechanically and radiographically.  We then irrigated the  soft tissue with normal saline solution and closed the joint capsule with interrupted #1 Ethibond, #1 Vicryl was used to close the tensor fascia and 0 Vicryl was used to close the deep tissue and 2-0 Vicryl was used to close the subcutaneous tissue.  The  skin was closed with staples.  Aquacel dressing was placed.  She was taken off the Hana table and taken to recovery room in stable condition with all final counts being correct and no complications noted.  Of note, Erskine Emery, PA-C did assist during  the entire case and his assistance was crucial for facilitating all aspects of this case.     Tracy.Kelley D: 11/10/2021 11:01:08 am T: 11/10/2021 11:18:00 am  JOB: [id number]/ [id number]
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
## 2021-11-10 NOTE — Anesthesia Procedure Notes (Signed)
Spinal  Patient location during procedure: OR Start time: 11/10/2021 9:35 AM End time: 11/10/2021 9:40 AM Reason for block: surgical anesthesia Staffing Performed: anesthesiologist  Anesthesiologist: Murvin Natal, MD Preanesthetic Checklist Completed: patient identified, IV checked, risks and benefits discussed, surgical consent, monitors and equipment checked, pre-op evaluation and timeout performed Spinal Block Patient position: sitting Prep: DuraPrep Patient monitoring: cardiac monitor, continuous pulse ox and blood pressure Approach: midline Location: L4-5 Injection technique: single-shot Needle Needle type: Pencan  Needle gauge: 24 G Needle length: 9 cm Assessment Sensory level: T10 Events: CSF return Additional Notes Functioning IV was confirmed and monitors were applied. Sterile prep and drape, including hand hygiene and sterile gloves were used. The patient was positioned and the spine was prepped. The skin was anesthetized with lidocaine.  Free flow of clear CSF was obtained prior to injecting local anesthetic into the CSF.  The spinal needle aspirated freely following injection.  The needle was carefully withdrawn.  The patient tolerated the procedure well.

## 2021-11-10 NOTE — Anesthesia Postprocedure Evaluation (Signed)
Anesthesia Post Note  Patient: Tracy Kelley  Procedure(s) Performed: RIGHT TOTAL HIP ARTHROPLASTY ANTERIOR APPROACH (Right: Hip)     Patient location during evaluation: PACU Anesthesia Type: Spinal Level of consciousness: awake Pain management: pain level controlled Vital Signs Assessment: post-procedure vital signs reviewed and stable Respiratory status: spontaneous breathing, respiratory function stable and patient connected to nasal cannula oxygen Cardiovascular status: blood pressure returned to baseline and stable Postop Assessment: no headache, no backache and no apparent nausea or vomiting Anesthetic complications: no   No notable events documented.  Last Vitals:  Vitals:   11/10/21 1701 11/10/21 1957  BP: 122/84 107/77  Pulse: 89 95  Resp: 16 18  Temp: 36.9 C 36.9 C  SpO2: 95% 98%    Last Pain:  Vitals:   11/10/21 2044  TempSrc:   PainSc: 3                  Garner Dullea P Kayleb Warshaw

## 2021-11-10 NOTE — Anesthesia Preprocedure Evaluation (Addendum)
Anesthesia Evaluation  Patient identified by MRN, date of birth, ID band Patient awake    Reviewed: Allergy & Precautions, NPO status , Patient's Chart, lab work & pertinent test results  Airway Mallampati: III  TM Distance: >3 FB Neck ROM: Full    Dental no notable dental hx.    Pulmonary neg pulmonary ROS,    Pulmonary exam normal breath sounds clear to auscultation       Cardiovascular hypertension, Pt. on medications Normal cardiovascular exam Rhythm:Regular Rate:Normal     Neuro/Psych PSYCHIATRIC DISORDERS Anxiety negative neurological ROS     GI/Hepatic negative GI ROS, Neg liver ROS,   Endo/Other  diabetes, Oral Hypoglycemic Agents  Renal/GU negative Renal ROS     Musculoskeletal  (+) Arthritis ,   Abdominal   Peds  Hematology negative hematology ROS (+)   Anesthesia Other Findings avascular necrosis right hip  Reproductive/Obstetrics                            Anesthesia Physical Anesthesia Plan  ASA: 3  Anesthesia Plan: Spinal   Post-op Pain Management:    Induction: Intravenous  PONV Risk Score and Plan: 2 and Ondansetron, Dexamethasone, Propofol infusion, Midazolam and Treatment may vary due to age or medical condition  Airway Management Planned: Simple Face Mask  Additional Equipment:   Intra-op Plan:   Post-operative Plan:   Informed Consent: I have reviewed the patients History and Physical, chart, labs and discussed the procedure including the risks, benefits and alternatives for the proposed anesthesia with the patient or authorized representative who has indicated his/her understanding and acceptance.     Dental advisory given  Plan Discussed with: CRNA  Anesthesia Plan Comments:         Anesthesia Quick Evaluation

## 2021-11-10 NOTE — Progress Notes (Signed)
CBG recheck at 1140- 61. MD Ellender notified, new orders for 12.5g IV D50 administered. Will recheck in 15 mins.

## 2021-11-11 ENCOUNTER — Encounter (HOSPITAL_COMMUNITY): Payer: Self-pay | Admitting: Orthopaedic Surgery

## 2021-11-11 DIAGNOSIS — M87051 Idiopathic aseptic necrosis of right femur: Secondary | ICD-10-CM | POA: Diagnosis not present

## 2021-11-11 LAB — BASIC METABOLIC PANEL
Anion gap: 6 (ref 5–15)
BUN: 13 mg/dL (ref 6–20)
CO2: 28 mmol/L (ref 22–32)
Calcium: 9.4 mg/dL (ref 8.9–10.3)
Chloride: 101 mmol/L (ref 98–111)
Creatinine, Ser: 1.21 mg/dL — ABNORMAL HIGH (ref 0.44–1.00)
GFR, Estimated: 55 mL/min — ABNORMAL LOW (ref >60)
Glucose, Bld: 99 mg/dL (ref 70–99)
Potassium: 3.4 mmol/L — ABNORMAL LOW (ref 3.5–5.1)
Sodium: 135 mmol/L (ref 135–145)

## 2021-11-11 LAB — GLUCOSE, CAPILLARY: Glucose-Capillary: 99 mg/dL (ref 70–99)

## 2021-11-11 LAB — CBC
HCT: 27.7 % — ABNORMAL LOW (ref 36.0–46.0)
Hemoglobin: 9 g/dL — ABNORMAL LOW (ref 12.0–15.0)
MCH: 25.3 pg — ABNORMAL LOW (ref 26.0–34.0)
MCHC: 32.5 g/dL (ref 30.0–36.0)
MCV: 77.8 fL — ABNORMAL LOW (ref 80.0–100.0)
Platelets: 316 10*3/uL (ref 150–400)
RBC: 3.56 MIL/uL — ABNORMAL LOW (ref 3.87–5.11)
RDW: 15 % (ref 11.5–15.5)
WBC: 8 10*3/uL (ref 4.0–10.5)
nRBC: 0 % (ref 0.0–0.2)

## 2021-11-11 MED ORDER — TIZANIDINE HCL 4 MG PO TABS: 4.0000 mg | ORAL_TABLET | Freq: Three times a day (TID) | ORAL | 1 refills | Status: DC | PRN

## 2021-11-11 MED ORDER — OXYCODONE HCL 5 MG PO TABS: 5.0000 mg | ORAL_TABLET | ORAL | 0 refills | Status: DC | PRN

## 2021-11-11 MED ORDER — ASPIRIN 81 MG PO CHEW: 81.0000 mg | CHEWABLE_TABLET | Freq: Two times a day (BID) | ORAL | 0 refills | Status: DC

## 2021-11-11 NOTE — Discharge Instructions (Signed)

## 2021-11-11 NOTE — Progress Notes (Signed)
Subjective: 1 Day Post-Op Procedure(s) (LRB): RIGHT TOTAL HIP ARTHROPLASTY ANTERIOR APPROACH (Right) Patient reports pain as moderate.    Objective: Vital signs in last 24 hours: Temp:  [97.3 F (36.3 C)-98.9 F (37.2 C)] 98.8 F (37.1 C) (01/18 0328) Pulse Rate:  [66-107] 107 (01/18 0328) Resp:  [10-19] 18 (01/18 0328) BP: (95-141)/(62-99) 111/62 (01/18 0328) SpO2:  [94 %-100 %] 100 % (01/18 0328) Weight:  [90.7 kg] 90.7 kg (01/17 0803)  Intake/Output from previous day: 01/17 0701 - 01/18 0700 In: 2920 [P.O.:1320; I.V.:1500; IV Piggyback:100] Out: 2800 [Urine:2600; Blood:200] Intake/Output this shift: No intake/output data recorded.  Recent Labs    11/11/21 0601  HGB 9.0*   Recent Labs    11/11/21 0601  WBC 8.0  RBC 3.56*  HCT 27.7*  PLT 316   Recent Labs    11/11/21 0601  NA 135  K 3.4*  CL 101  CO2 28  BUN 13  CREATININE 1.21*  GLUCOSE 99  CALCIUM 9.4   No results for input(s): LABPT, INR in the last 72 hours.  Sensation intact distally Intact pulses distally Dorsiflexion/Plantar flexion intact Incision: scant drainage   Assessment/Plan: 1 Day Post-Op Procedure(s) (LRB): RIGHT TOTAL HIP ARTHROPLASTY ANTERIOR APPROACH (Right) Up with therapy Discharge home with home health      Tracy Kelley 11/11/2021, 7:36 AM

## 2021-11-11 NOTE — Discharge Summary (Signed)
Patient ID: Tracy Kelley MRN: [id number] DOB/AGE: [birth year] 51 y.o.  Admit date: 11/10/2021 Discharge date: 11/11/2021  Admission Diagnoses:  Principal Problem:   Avascular necrosis of bone of right hip ([place]) Active Problems:   Status post total replacement of right hip   Discharge Diagnoses:  Same  Past Medical History:  Diagnosis Date   Anxiety    Diabetes mellitus without complication ([place])    Hyperlipidemia    Hypertension    Thyroid disease     Surgeries: Procedure(s): RIGHT TOTAL HIP ARTHROPLASTY ANTERIOR APPROACH on 11/10/2021   Consultants:   Discharged Condition: Improved  Hospital Course: Tracy Kelley is an 51 y.o. female who was admitted 11/10/2021 for operative treatment ofAvascular necrosis of bone of right hip (Tracy Kelley). Patient has severe unremitting pain that affects sleep, daily activities, and work/hobbies. After pre-op clearance the patient was taken to the operating room on 11/10/2021 and underwent  Procedure(s): RIGHT TOTAL HIP ARTHROPLASTY ANTERIOR APPROACH.    Patient was given perioperative antibiotics:  Anti-infectives (From admission, onward)    Start     Dose/Rate Route Frequency Ordered Stop   11/10/21 1345  ceFAZolin (ANCEF) IVPB 1 g/50 mL premix        1 g 100 mL/hr over 30 Minutes Intravenous Every 6 hours 11/10/21 1247 11/10/21 2033   11/10/21 0715  ceFAZolin (ANCEF) IVPB 2g/100 mL premix        2 g 200 mL/hr over 30 Minutes Intravenous On call to O.R. 11/10/21 0703 11/10/21 0626        Patient was given sequential compression devices, early ambulation, and chemoprophylaxis to prevent DVT.  Patient benefited maximally from hospital stay and there were no complications.    Recent vital signs: Patient Vitals for the past 24 hrs:  BP Temp Temp src Pulse Resp SpO2 Height Weight  11/11/21 0328 111/62 98.8 F (37.1 C) Oral (!) 107 18 100 % -- --  11/10/21 2306 117/65 98.9 F (37.2 C) Oral 100 18 94 % -- --  11/10/21 1957 107/77 98.4 F  (36.9 C) Oral 95 18 98 % -- --  11/10/21 1701 122/84 98.5 F (36.9 C) Oral 89 16 95 % -- --  11/10/21 1233 (!) 141/96 98 F (36.7 C) -- 92 18 98 % -- --  11/10/21 1217 133/90 97.6 F (36.4 C) -- 69 17 99 % -- --  11/10/21 1202 (!) 123/99 -- -- 71 19 100 % -- --  11/10/21 1147 118/79 -- -- 71 10 100 % -- --  11/10/21 1132 119/89 -- -- 81 19 100 % -- --  11/10/21 1117 104/74 (!) 97.3 F (36.3 C) -- 90 15 99 % -- --  11/10/21 0803 95/66 98.3 F (36.8 C) Oral 66 18 99 % 5\' 9"  (1.753 m) 90.7 kg     Recent laboratory studies:  Recent Labs    11/11/21 0601  WBC 8.0  HGB 9.0*  HCT 27.7*  PLT 316  NA 135  K 3.4*  CL 101  CO2 28  BUN 13  CREATININE 1.21*  GLUCOSE 99  CALCIUM 9.4     Discharge Medications:   Allergies as of 11/11/2021       Reactions   Metformin And Related Nausea Only   GI intolerance nausea and diarreha        Medication List     STOP taking these medications    HYDROcodone-acetaminophen 5-325 MG tablet Commonly known as: NORCO/VICODIN       TAKE these medications  amLODipine 10 MG tablet Commonly known as: NORVASC TAKE 1 TABLET(10 MG) BY MOUTH DAILY   aspirin 81 MG chewable tablet Chew 1 tablet (81 mg total) by mouth 2 (two) times daily.   fenofibrate 160 MG tablet TAKE 1 TABLET BY MOUTH DAILY   Fluarix Quadrivalent 0.5 ML injection Generic drug: influenza vac split quadrivalent PF Inject into the muscle.   glipiZIDE 5 MG tablet Commonly known as: GLUCOTROL Take 1 tablet (5 mg total) by mouth daily.   hydrochlorothiazide 25 MG tablet Commonly known as: HYDRODIURIL TAKE 1 TABLET(25 MG) BY MOUTH DAILY   Lancets 30G Misc Please dispense lancets to go with patient's blood glucose monitor   multivitamin with minerals Tabs tablet Take 1 tablet by mouth daily.   naproxen 500 MG tablet Commonly known as: NAPROSYN TAKE 1 TABLET(500 MG) BY MOUTH TWICE DAILY WITH A MEAL   oxyCODONE 5 MG immediate release tablet Commonly known  as: Oxy IR/ROXICODONE Take 1-2 tablets (5-10 mg total) by mouth every 4 (four) hours as needed for moderate pain (pain score 4-6).   tiZANidine 4 MG tablet Commonly known as: ZANAFLEX Take 1 tablet (4 mg total) by mouth every 8 (eight) hours as needed for muscle spasms. What changed: See the new instructions.               Durable Medical Equipment  (From admission, onward)           Start     Ordered   11/10/21 1248  DME Walker rolling  Once       Question Answer Comment  Walker: With 5 Inch Wheels   Patient needs a walker to treat with the following condition Status post total hip replacement, right      11/10/21 1247   11/10/21 1248  DME 3 n 1  Once        11/10/21 1247            Diagnostic Studies: DG Pelvis Portable  Result Date: 11/10/2021 CLINICAL DATA:  Post right hip replacement EXAM: PORTABLE PELVIS 1-2 VIEWS COMPARISON:  None. FINDINGS: Right total hip arthroplasty. No evidence of complication on this image. IMPRESSION: Post right total hip arthroplasty. Electronically Signed   By: Macy Mis M.D.   On: 11/10/2021 11:54   DG C-Arm 1-60 Min-No Report  Result Date: 11/10/2021 Fluoroscopy was utilized by the requesting physician.  No radiographic interpretation.   DG HIP UNILAT WITH PELVIS 2-3 VIEWS RIGHT  Result Date: 11/10/2021 CLINICAL DATA:  Post RIGHT hip arthroplasty EXAM: DG HIP (WITH OR WITHOUT PELVIS) 2-3V RIGHT COMPARISON:  08/11/2021 FLUOROSCOPY TIME:  0 minutes 23 seconds Dose: 2.9170 mGy Images: 3 FINDINGS: New RIGHT hip prosthesis. No fracture or dislocation. Pelvis unremarkable. IMPRESSION: Post RIGHT hip arthroplasty without acute complication. Electronically Signed   By: Lavonia Dana M.D.   On: 11/10/2021 11:24    Disposition: Discharge disposition: 01-Home or Self Care       Discharge Instructions     Discharge patient   Complete by: As directed    Discharge disposition: 01-Home or Self Care   Discharge patient date:  11/11/2021        Follow-up Information     [name], MD Follow up in 2 week(s).   Specialty: Orthopedic Surgery Contact information: [address] [place] [address] [phone]                  Signed: [name] 11/11/2021, 8:02 AM

## 2021-11-11 NOTE — Progress Notes (Signed)
Physical Therapy Treatment and Discharge Patient Details Name: Tracy Kelley MRN: [id number] DOB: 07-May-1971 Today's Date: 11/11/2021   History of Present Illness Pt is a 51 y/o female s/p R THA, direct anterior. PMH includes HTN and DM.    PT Comments    Pt met her physical therapy goals during her inpatient stay. Ambulating 200 feet with a walker modI, utilizing a step through pattern. Reviewed and performed sitting and standing HEP for strengthening (written instruction provided). Pt with no further questions or concerns. No further acute PT needs; thank you for this consult.     Recommendations for follow up therapy are one component of a multi-disciplinary discharge planning process, led by the attending physician.  Recommendations may be updated based on patient status, additional functional criteria and insurance authorization.  Follow Up Recommendations  Follow physician's recommendations for discharge plan and follow up therapies     Assistance Recommended at Discharge Intermittent Supervision/Assistance  Patient can return home with the following     Equipment Recommendations  BSC/3in1 (Reports aunt has RW)    Recommendations for Other Services       Precautions / Restrictions Precautions Precautions: Fall Restrictions Weight Bearing Restrictions: Yes RLE Weight Bearing: Weight bearing as tolerated     Mobility  Bed Mobility               General bed mobility comments: OOB in chair    Transfers Overall transfer level: Modified independent Equipment used: Rolling walker (2 wheels)                    Ambulation/Gait Ambulation/Gait assistance: Modified independent (Device/Increase time) Gait Distance (Feet): 200 Feet Assistive device: Rolling walker (2 wheels) Gait Pattern/deviations: Step-through pattern, Decreased step length - left, Decreased weight shift to right Gait velocity: decreased     General Gait Details: Min cues for right heel  strike and equal step length. step through pattern   Stairs             Wheelchair Mobility    Modified Rankin (Stroke Patients Only)       Balance Overall balance assessment: Needs assistance Sitting-balance support: Feet supported Sitting balance-Leahy Scale: Good     Standing balance support: No upper extremity supported, During functional activity Standing balance-Leahy Scale: Fair                              Cognition Arousal/Alertness: Awake/alert Behavior During Therapy: WFL for tasks assessed/performed Overall Cognitive Status: Within Functional Limits for tasks assessed                                          Exercises Total Joint Exercises Hip ABduction/ADduction: Right, 10 reps, Standing Long Arc Quad: Right, 15 reps, Seated Standing Hip Extension: Right, 10 reps, Standing Other Exercises Other Exercises: Standing: bilateral hamstring curls x 10 each    General Comments        Pertinent Vitals/Pain Pain Assessment Pain Assessment: Faces Faces Pain Scale: Hurts little more Pain Location: R hip Pain Descriptors / Indicators: Grimacing, Guarding Pain Intervention(s): Monitored during session    Home Living                          Prior Function  PT Goals (current goals can now be found in the care plan section) Acute Rehab PT Goals Patient Stated Goal: to decrease pain PT Goal Formulation: With patient Time For Goal Achievement: 11/24/21 Potential to Achieve Goals: Good Progress towards PT goals: Goals met/education completed, patient discharged from PT    Frequency    7X/week      PT Plan Other (comment) (d/c therapies)    Co-evaluation              AM-PAC PT "6 Clicks" Mobility   Outcome Measure  Help needed turning from your back to your side while in a flat bed without using bedrails?: None Help needed moving from lying on your back to sitting on the side of a  flat bed without using bedrails?: None Help needed moving to and from a bed to a chair (including a wheelchair)?: None Help needed standing up from a chair using your arms (e.g., wheelchair or bedside chair)?: None Help needed to walk in hospital room?: None Help needed climbing 3-5 steps with a railing? : A Little 6 Click Score: 23    End of Session   Activity Tolerance: Patient tolerated treatment well Patient left: with call bell/phone within reach;in chair Nurse Communication: Mobility status PT Visit Diagnosis: Other abnormalities of gait and mobility (R26.89);Pain Pain - Right/Left: Right Pain - part of body: Hip     Time: 5027-1423 PT Time Calculation (min) (ACUTE ONLY): 22 min  Charges:  $Gait Training: 8-22 mins                     Tracy Kelley, PT, DPT Acute Rehabilitation Services Pager [phone] Office [phone]    Deno Etienne 11/11/2021, 10:49 AM

## 2021-11-11 NOTE — Plan of Care (Signed)
Pt doing well. Pt given D/C instructions with verbal understanding. Rx's were sent to the pharmacy by MD. Pt's incision is clean and dry with no sign of infection. Pt's IV was removed prior to D/C. Pt received 3-n-1 from [facility] per MD order. Pt D/C'd home via wheelchair per MD order. Pt is stable @ D/C and has no other needs at this time. Holli Humbles, RN

## 2021-11-11 NOTE — TOC Initial Note (Signed)
Transition of Care [facility]) - Initial/Assessment Note    Patient Details  Name: Tracy Kelley MRN: [id number] Date of Birth: [birth year]  [facility]) CM/SW Contact:    Marilu Favre, RN Phone Number: 11/11/2021, 8:28 AM  Clinical Narrative:                 Spoke to patient at bedside. Confirmed face sheet information. Discussed home health PT. [facility] unable to provide services.   Tommi Rumps with [facility] accepted referral.   [facility] staff will provide any DME needed.     Expected Discharge Plan: [place] Barriers to Discharge: No Barriers Identified   Patient Goals and CMS Choice Patient states their goals for this hospitalization and ongoing recovery are:: to return to home CMS Medicare.gov Compare Post Acute Care list provided to:: Patient Choice offered to / list presented to : Patient  Expected Discharge Plan and Services Expected Discharge Plan: [place] Choice: [place] arrangements for the past 2 months: Single Family Home Expected Discharge Date: 11/11/21                 DME Agency: NA       HH Arranged: PT HH Agency: [place] Date [facility] Agency Contacted: 11/11/21 Time [place]: 3532 Representative spoke with at [place]: Tommi Rumps  Prior Living Arrangements/Services Living arrangements for the past 2 months: [place] Lives with:: Relatives Patient language and need for interpreter reviewed:: Yes Do you feel safe going back to the place where you live?: Yes      Need for Family Participation in Patient Care: Yes (Comment) Care giver support system in place?: Yes (comment)   Criminal Activity/Legal Involvement Pertinent to Current Situation/Hospitalization: No - Comment as needed  Activities of Daily Living      Permission Sought/Granted   Permission granted to share information with : No              Emotional Assessment Appearance::  Appears stated age Attitude/Demeanor/Rapport: Engaged Affect (typically observed): Accepting Orientation: : Oriented to Self, Oriented to Place, Oriented to  Time, Oriented to Situation Alcohol / Substance Use: Not Applicable Psych Involvement: No (comment)  Admission diagnosis:  Status post total replacement of right hip [Z96.641] Patient Active Problem List   Diagnosis Date Noted   Status post total replacement of right hip 11/10/2021   Dense breasts 10/10/2021   Amenorrhea 10/10/2021   Avascular necrosis of bone of right hip ([place]) 09/14/2021   Foraminal stenosis of lumbar region 12/02/2020   Sepsis ([place]) 06/04/2017   UTI (urinary tract infection) 06/04/2017   Enteritis 06/04/2017   Leukocytosis 06/04/2017   Goiter diffuse 05/05/2017   Diabetes mellitus type 2 in nonobese ([place]) 11/05/2016   Essential hypertension 11/05/2016   Hyperlipemia 11/05/2016   Insomnia secondary to anxiety 11/05/2016   PCP:  Coral Spikes, DO Pharmacy:   [facility] [place], [place] AT [place] [address] [place] [address] Phone: [phone] Fax: [phone]     Social Determinants of Health (SDOH) Interventions    Readmission Risk Interventions No flowsheet data found.

## 2021-11-23 ENCOUNTER — Ambulatory Visit (INDEPENDENT_AMBULATORY_CARE_PROVIDER_SITE_OTHER): Payer: PRIVATE HEALTH INSURANCE | Admitting: Orthopaedic Surgery

## 2021-11-23 DIAGNOSIS — M87051 Idiopathic aseptic necrosis of right femur: Secondary | ICD-10-CM

## 2021-11-23 DIAGNOSIS — Z96641 Presence of right artificial hip joint: Secondary | ICD-10-CM

## 2021-11-23 MED ORDER — OXYCODONE HCL 5 MG PO TABS: 5.0000 mg | ORAL_TABLET | Freq: Four times a day (QID) | ORAL | 0 refills | Status: DC | PRN

## 2021-11-23 MED ORDER — TIZANIDINE HCL 4 MG PO TABS: 4.0000 mg | ORAL_TABLET | Freq: Three times a day (TID) | ORAL | 1 refills | Status: DC | PRN

## 2021-11-23 NOTE — Progress Notes (Signed)
The patient is 2 weeks tomorrow status post a right total hip arthroplasty.  She is relating with a cane and doing well.  She actually drove today.  She has been compliant with her baby aspirin twice a day.  She is asking for refills of oxycodone and Zanaflex.  She did drop today as well.  She is not taking narcotic and getting behind the wheel.  She says that she is doing very well overall.  On exam her right hip incision looks good.  I remove the staples and place Steri-Strips.  Her leg lengths are equal.  Her calf is soft on the right side.  She will continue to slowly increase her activities as comfort allows.  We will see her back in 4 weeks and if she is doing well can get her back to work at that standpoint.  I did refill her pain medication and muscle relaxant.  All questions and concerns were answered and addressed.

## 2021-11-24 ENCOUNTER — Telehealth: Payer: Self-pay | Admitting: Family Medicine

## 2021-11-24 NOTE — Telephone Encounter (Signed)
Patient states she is checking blood sugars twice a day

## 2021-11-24 NOTE — Telephone Encounter (Signed)
Prescription faxed to [facility]

## 2021-11-24 NOTE — Telephone Encounter (Signed)
Pt needs strips called in to her pharmacy, [facility] [address] to check her blood sugar.   [phone]

## 2021-12-01 ENCOUNTER — Telehealth: Payer: Self-pay | Admitting: Orthopaedic Surgery

## 2021-12-01 ENCOUNTER — Other Ambulatory Visit: Payer: Self-pay | Admitting: Orthopaedic Surgery

## 2021-12-01 ENCOUNTER — Telehealth: Payer: Self-pay | Admitting: Family Medicine

## 2021-12-01 MED ORDER — GLUCOSE BLOOD VI STRP: ORAL_STRIP | 5 refills | Status: AC

## 2021-12-01 MED ORDER — OXYCODONE HCL 5 MG PO TABS: 5.0000 mg | ORAL_TABLET | Freq: Four times a day (QID) | ORAL | 0 refills | Status: DC | PRN

## 2021-12-01 NOTE — Telephone Encounter (Signed)
Test strips sent to pharmacy.

## 2021-12-01 NOTE — Telephone Encounter (Signed)
Left message to return call  Need to see what type of meter she has now and how often she is testing

## 2021-12-01 NOTE — Telephone Encounter (Signed)
Pt would like refill on oxycodone.   CB [phone]

## 2021-12-01 NOTE — Telephone Encounter (Signed)
Patient has an acc-check and one touch.  She is currently checking her blood sugars once a day with the one touch and those are the strips that she is out of currently.  She also said that a new testing kit can be sent in.

## 2021-12-01 NOTE — Telephone Encounter (Signed)
Pt called and stated her pharmacy, [facility] [address], no longer carries the strips for her meter for diabetes. She asked that we prescribe her a new kit. Pt does not have any strips.   [phone]

## 2021-12-10 ENCOUNTER — Telehealth: Payer: Self-pay | Admitting: Orthopaedic Surgery

## 2021-12-10 ENCOUNTER — Other Ambulatory Visit: Payer: Self-pay | Admitting: Orthopaedic Surgery

## 2021-12-10 MED ORDER — OXYCODONE HCL 5 MG PO TABS: 5.0000 mg | ORAL_TABLET | Freq: Four times a day (QID) | ORAL | 0 refills | Status: DC | PRN

## 2021-12-10 NOTE — Telephone Encounter (Signed)
Patient requesting a medication refill for Oxycodone.

## 2021-12-17 ENCOUNTER — Telehealth: Payer: Self-pay | Admitting: Orthopaedic Surgery

## 2021-12-17 ENCOUNTER — Other Ambulatory Visit: Payer: Self-pay | Admitting: Orthopaedic Surgery

## 2021-12-17 MED ORDER — OXYCODONE HCL 5 MG PO TABS: 5.0000 mg | ORAL_TABLET | Freq: Three times a day (TID) | ORAL | 0 refills | Status: DC | PRN

## 2021-12-17 NOTE — Telephone Encounter (Signed)
Please advise 

## 2021-12-17 NOTE — Telephone Encounter (Signed)
Patient called needing Rx refilled Oxycodone. The number to contact patient is [phone]

## 2021-12-21 ENCOUNTER — Encounter: Payer: Self-pay | Admitting: Orthopaedic Surgery

## 2021-12-21 ENCOUNTER — Ambulatory Visit (INDEPENDENT_AMBULATORY_CARE_PROVIDER_SITE_OTHER): Payer: PRIVATE HEALTH INSURANCE | Admitting: Orthopaedic Surgery

## 2021-12-21 DIAGNOSIS — Z96641 Presence of right artificial hip joint: Secondary | ICD-10-CM

## 2021-12-21 NOTE — Progress Notes (Signed)
The patient is a very pleasant 51 year old female who is 6 weeks status post a right total hip arthroplasty that was needed secondary to avascular necrosis.  She does have known AVN of her left hip but is asymptomatic still with her left hip.  It was not as severe on MRI of the left hip.  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  She does have some tightness and stiffness in the morning.  I told her that is reasonable and to be expected and will hopefully resolve as time goes by.  She walks without assistive device and minimal limp.  Her right operative hip is still just slightly stiff but has pretty good range of motion.  Her left hip is completely asymptomatic with full range of motion.  At this point she will continue to increase her activities as comfort allows.  All questions and concerns were answered and addressed.  I would like to see her back in 6 months with a standing low AP pelvis and lateral of her right operative hip.  If there are issues before that with her hip she knows to let us know.

## 2021-12-29 ENCOUNTER — Telehealth: Payer: Self-pay | Admitting: Orthopaedic Surgery

## 2021-12-29 ENCOUNTER — Other Ambulatory Visit: Payer: Self-pay | Admitting: Orthopaedic Surgery

## 2021-12-29 MED ORDER — HYDROCODONE-ACETAMINOPHEN 7.5-325 MG PO TABS: 1.0000 | ORAL_TABLET | Freq: Three times a day (TID) | ORAL | 0 refills | Status: DC | PRN

## 2021-12-29 NOTE — Telephone Encounter (Signed)
Called and advised. Pt stated understanding  °

## 2021-12-29 NOTE — Telephone Encounter (Signed)
Patient called needing Rx refilled Oxycodone. The number to contact patient is [phone] ?

## 2021-12-29 NOTE — Telephone Encounter (Signed)
Please advise 

## 2021-12-30 ENCOUNTER — Telehealth: Payer: Self-pay | Admitting: Orthopaedic Surgery

## 2021-12-30 NOTE — Telephone Encounter (Signed)
Faxed to provided number ?Patient aware that I did so  ?

## 2021-12-30 NOTE — Telephone Encounter (Signed)
Pt called requesting a back to work note to employer with no restrictions. Please fax to [phone] Attention Leigh Loletha Grayer. Pt phone number is [phone]. ?

## 2022-01-11 ENCOUNTER — Other Ambulatory Visit: Payer: Self-pay

## 2022-01-11 MED ORDER — TIZANIDINE HCL 4 MG PO TABS: 4.0000 mg | ORAL_TABLET | Freq: Three times a day (TID) | ORAL | 1 refills | Status: DC | PRN

## 2022-01-12 ENCOUNTER — Telehealth: Payer: Self-pay | Admitting: Physician Assistant

## 2022-01-12 ENCOUNTER — Other Ambulatory Visit: Payer: Self-pay | Admitting: Orthopaedic Surgery

## 2022-01-12 MED ORDER — HYDROCODONE-ACETAMINOPHEN 7.5-325 MG PO TABS: 1.0000 | ORAL_TABLET | Freq: Three times a day (TID) | ORAL | 0 refills | Status: DC | PRN

## 2022-01-12 NOTE — Telephone Encounter (Signed)
Please advise 

## 2022-01-12 NOTE — Telephone Encounter (Signed)
Pt called requesting a refill of hydrocodone. Please send to pharmacy on file. Phone number is [phone]. ?

## 2022-01-25 ENCOUNTER — Telehealth: Payer: Self-pay | Admitting: Physician Assistant

## 2022-01-25 ENCOUNTER — Other Ambulatory Visit: Payer: Self-pay | Admitting: Orthopaedic Surgery

## 2022-01-25 MED ORDER — HYDROCODONE-ACETAMINOPHEN 7.5-325 MG PO TABS: 1.0000 | ORAL_TABLET | Freq: Three times a day (TID) | ORAL | 0 refills | Status: DC | PRN

## 2022-01-25 NOTE — Telephone Encounter (Signed)
Pt called requesting a refill of hydrocodone. Please send to pharmacy on file. Phone number is [phone]. ?

## 2022-01-25 NOTE — Telephone Encounter (Signed)
Please advise 

## 2022-02-08 ENCOUNTER — Other Ambulatory Visit: Payer: Self-pay | Admitting: Orthopaedic Surgery

## 2022-02-08 ENCOUNTER — Telehealth: Payer: Self-pay | Admitting: Orthopaedic Surgery

## 2022-02-08 MED ORDER — HYDROCODONE-ACETAMINOPHEN 7.5-325 MG PO TABS: 1.0000 | ORAL_TABLET | Freq: Three times a day (TID) | ORAL | 0 refills | Status: DC | PRN

## 2022-02-08 NOTE — Telephone Encounter (Signed)
11/10/21 THA ?

## 2022-02-08 NOTE — Telephone Encounter (Signed)
Pt is calling to get a refill on Hydrocodone  ?

## 2022-02-17 ENCOUNTER — Other Ambulatory Visit: Payer: Self-pay | Admitting: Family Medicine

## 2022-02-17 DIAGNOSIS — E785 Hyperlipidemia, unspecified: Secondary | ICD-10-CM

## 2022-02-18 NOTE — Telephone Encounter (Signed)
Sent my chart message to schedule appointment.

## 2022-02-19 NOTE — Telephone Encounter (Signed)
Scheduled appointment for 5/8 ?

## 2022-02-24 ENCOUNTER — Other Ambulatory Visit: Payer: Self-pay | Admitting: Physician Assistant

## 2022-02-24 MED ORDER — HYDROCODONE-ACETAMINOPHEN 7.5-325 MG PO TABS: 1.0000 | ORAL_TABLET | Freq: Four times a day (QID) | ORAL | 0 refills | Status: DC | PRN

## 2022-02-24 NOTE — Telephone Encounter (Signed)
Patient called in requesting refill of hydrocodone sent to preferred pharmacy ? ?

## 2022-03-01 ENCOUNTER — Encounter: Payer: Self-pay | Admitting: Family Medicine

## 2022-03-01 ENCOUNTER — Ambulatory Visit (INDEPENDENT_AMBULATORY_CARE_PROVIDER_SITE_OTHER): Payer: PRIVATE HEALTH INSURANCE | Admitting: Family Medicine

## 2022-03-01 VITALS — BP 113/74 | HR 84 | Temp 98.5°F | Wt 206.4 lb

## 2022-03-01 DIAGNOSIS — E785 Hyperlipidemia, unspecified: Secondary | ICD-10-CM

## 2022-03-01 DIAGNOSIS — D649 Anemia, unspecified: Secondary | ICD-10-CM

## 2022-03-01 DIAGNOSIS — E119 Type 2 diabetes mellitus without complications: Secondary | ICD-10-CM

## 2022-03-01 DIAGNOSIS — I10 Essential (primary) hypertension: Secondary | ICD-10-CM | POA: Diagnosis not present

## 2022-03-01 MED ORDER — FENOFIBRATE 160 MG PO TABS: 160.0000 mg | ORAL_TABLET | Freq: Every day | ORAL | 1 refills | Status: DC

## 2022-03-01 MED ORDER — AMLODIPINE BESYLATE 10 MG PO TABS: ORAL_TABLET | ORAL | 1 refills | Status: DC

## 2022-03-01 MED ORDER — GLIPIZIDE 5 MG PO TABS: 5.0000 mg | ORAL_TABLET | Freq: Every day | ORAL | 1 refills | Status: DC

## 2022-03-01 MED ORDER — HYDROCHLOROTHIAZIDE 25 MG PO TABS: ORAL_TABLET | ORAL | 1 refills | Status: DC

## 2022-03-01 NOTE — Patient Instructions (Addendum)
Labs ordered. Get them done at your convenience. ? ?Follow up in 6 months. ? ?Take care ? ?Dr. Lacinda Axon  ?

## 2022-03-02 DIAGNOSIS — E785 Hyperlipidemia, unspecified: Secondary | ICD-10-CM | POA: Insufficient documentation

## 2022-03-02 NOTE — Progress Notes (Signed)
? ?Subjective:  ?Patient ID: Tracy Kelley, female    DOB: [birth year]  Age: 51 y.o. MRN: [id number] ? ?CC: ?Chief Complaint  ?Patient presents with  ? Hypertension  ?  Med follow up  ? ? ?HPI: ? ?51 year old female with hypertension, type 2 diabetes, hyperlipidemia presents for follow-up. ? ?Patient's blood sugars have been well controlled.  Last A1c from January was 6.1.  She is currently on glipizide 5 mg daily.  Denies hypoglycemia. ? ?Patient has had recent hip replacement.  She is still having some discomfort particularly on the lateral aspect of the thigh.  However, she states that this is slowly improving. ? ?Patient has history of elevated triglycerides, low HDL.  Patient is currently on fenofibrate. ? ?Hypertension is well controlled on HCTZ and Amlodipine.  ? ?Patient Active Problem List  ? Diagnosis Date Noted  ? Hyperlipidemia 03/02/2022  ? 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 Dense breasts 10/10/2021  ? Foraminal stenosis of lumbar region 12/02/2020  ? Diabetes mellitus type 2 in nonobese ([place]) 11/05/2016  ? Essential hypertension 11/05/2016  ? Insomnia secondary to anxiety 11/05/2016  ? ? ?Social Hx   ?Social History  ? ?Socioeconomic History  ? Marital status: Divorced  ?  Spouse name: Not on file  ? Number of children: 4  ? Years of education: 66  ? Highest education level: Not on file  ?Occupational History  ? Occupation: cust service  ?  Employer: [employer]  ?Tobacco Use  ? Smoking status: Never  ? Smokeless tobacco: Never  ?Vaping Use  ? Vaping Use: Never used  ?Substance and Sexual Activity  ? Alcohol use: Not Currently  ?  Alcohol/week: 3.0 standard drinks  ?  Types: 3 Glasses of wine per week  ?  Comment: occasional wine  ? Drug use: No  ? Sexual activity: Yes  ?  Birth control/protection: Post-menopausal  ?  Comment: separated  ?Other Topics Concern  ? Not on file  ?Social History Narrative  ? Separated  ? Works for [employer]  ? 4 boys  ? 2 at home   ? ?Social Determinants of Health  ? ?Financial Resource Strain: Not on file  ?Food Insecurity: Not on file  ?Transportation Needs: Not on file  ?Physical Activity: Not on file  ?Stress: Not on file  ?Social Connections: Not on file  ? ? ?Review of Systems  ?Constitutional: Negative.   ?Musculoskeletal:   ?     Hip pain.  ? ? ?Objective:  ?BP 113/74   Pulse 84   Temp 98.5 ?F (36.9 ?C)   Wt 206 lb 6.4 oz (93.6 kg)   LMP 03/26/2019   SpO2 99%   BMI 30.48 kg/m?  ? ? ?  03/01/2022  ?  2:00 PM 11/11/2021  ?  9:06 AM 11/11/2021  ?  3:28 AM  ?BP/Weight  ?Systolic BP [phone]  ?Diastolic BP 74 73 62  ?Wt. (Lbs) 206.4    ?BMI 30.48 kg/m2    ? ? ?Physical Exam ?Vitals and nursing note reviewed.  ?Constitutional:   ?   General: She is not in acute distress. ?   Appearance: Normal appearance. She is not ill-appearing.  ?HENT:  ?   Head: Normocephalic and atraumatic.  ?Eyes:  ?   General:     ?   Right eye: No discharge.     ?   Left eye: No discharge.  ?   Conjunctiva/sclera: Conjunctivae normal.  ?  Cardiovascular:  ?   Rate and Rhythm: Normal rate and regular rhythm.  ?Pulmonary:  ?   Effort: Pulmonary effort is normal.  ?   Breath sounds: Normal breath sounds. No wheezing, rhonchi or rales.  ?Neurological:  ?   Mental Status: She is alert.  ?Psychiatric:     ?   Mood and Affect: Mood normal.     ?   Behavior: Behavior normal.  ? ? ?Lab Results  ?Component Value Date  ? WBC 8.0 11/11/2021  ? HGB 9.0 (L) 11/11/2021  ? HCT 27.7 (L) 11/11/2021  ? PLT 316 11/11/2021  ? GLUCOSE 99 11/11/2021  ? CHOL 189 01/14/2021  ? TRIG 337 (H) 01/14/2021  ? HDL 37 (L) 01/14/2021  ? [place] 96 01/14/2021  ? ALT 11 07/08/2021  ? AST 16 07/08/2021  ? NA 135 11/11/2021  ? K 3.4 (L) 11/11/2021  ? CL 101 11/11/2021  ? CREATININE 1.21 (H) 11/11/2021  ? BUN 13 11/11/2021  ? CO2 28 11/11/2021  ? TSH 1.860 01/14/2021  ? HGBA1C 6.1 (H) 11/06/2021  ? MICROALBUR 1.0 05/11/2017  ? ? ? ?Assessment & Plan:  ? ?Problem List Items Addressed This Visit   ? ?   ? Cardiovascular and Mediastinum  ? Essential hypertension  ?  Well-controlled.  Continue HCTZ and amlodipine. ? ?  ?  ? Relevant Medications  ? amLODipine (NORVASC) 10 MG tablet  ? fenofibrate 160 MG tablet  ? hydrochlorothiazide (HYDRODIURIL) 25 MG tablet  ?  ? Endocrine  ? Diabetes mellitus type 2 in nonobese [facility]) - Primary  ?  Stable.  Labs ordered.  Continue glipizide. ? ?  ?  ? Relevant Medications  ? glipiZIDE (GLUCOTROL) 5 MG tablet  ? Other Relevant Orders  ? CMP14+EGFR  ? Hemoglobin A1c  ? Microalbumin / creatinine urine ratio  ?  ? Other  ? Hyperlipidemia  ?  Lipid panel ordered.  Continue fenofibrate.  May need statin. ? ?  ?  ? Relevant Medications  ? amLODipine (NORVASC) 10 MG tablet  ? fenofibrate 160 MG tablet  ? hydrochlorothiazide (HYDRODIURIL) 25 MG tablet  ? Other Relevant Orders  ? Lipid panel  ? ?Other Visit Diagnoses   ? ? Anemia, unspecified type      ? Relevant Orders  ? CBC  ? ?  ? ? ?Meds ordered this encounter  ?Medications  ? amLODipine (NORVASC) 10 MG tablet  ?  Sig: TAKE 1 TABLET(10 MG) BY MOUTH DAILY  ?  Dispense:  90 tablet  ?  Refill:  1  ? fenofibrate 160 MG tablet  ?  Sig: Take 1 tablet (160 mg total) by mouth daily.  ?  Dispense:  90 tablet  ?  Refill:  1  ? hydrochlorothiazide (HYDRODIURIL) 25 MG tablet  ?  Sig: TAKE 1 TABLET(25 MG) BY MOUTH DAILY  ?  Dispense:  90 tablet  ?  Refill:  1  ? glipiZIDE (GLUCOTROL) 5 MG tablet  ?  Sig: Take 1 tablet (5 mg total) by mouth daily.  ?  Dispense:  90 tablet  ?  Refill:  1  ? ?Thersa Salt DO ?[place] ? ?

## 2022-03-02 NOTE — Assessment & Plan Note (Signed)
Lipid panel ordered.  Continue fenofibrate.  May need statin. ?

## 2022-03-02 NOTE — Assessment & Plan Note (Signed)
Well-controlled.  Continue HCTZ and amlodipine. ?

## 2022-03-02 NOTE — Assessment & Plan Note (Signed)
Stable.  Labs ordered.  Continue glipizide. ?

## 2022-03-05 ENCOUNTER — Other Ambulatory Visit: Payer: Self-pay | Admitting: Orthopaedic Surgery

## 2022-03-08 ENCOUNTER — Telehealth: Payer: Self-pay | Admitting: Orthopaedic Surgery

## 2022-03-08 ENCOUNTER — Other Ambulatory Visit: Payer: Self-pay | Admitting: Orthopaedic Surgery

## 2022-03-08 MED ORDER — HYDROCODONE-ACETAMINOPHEN 7.5-325 MG PO TABS: 1.0000 | ORAL_TABLET | Freq: Three times a day (TID) | ORAL | 0 refills | Status: DC | PRN

## 2022-03-08 NOTE — Telephone Encounter (Signed)
Pt called requesting a refill of hydrocodone. Please send to pharmacy on file. Pt phone number is [phone] ?

## 2022-03-11 ENCOUNTER — Telehealth: Payer: Self-pay | Admitting: Orthopaedic Surgery

## 2022-03-11 NOTE — Telephone Encounter (Signed)
Please advise 

## 2022-03-11 NOTE — Telephone Encounter (Signed)
Appt scheduled next Tuesday

## 2022-03-11 NOTE — Telephone Encounter (Signed)
Pt called having severe pains in right hip surgery. Pt states she had total right hip in Jan and pains are getting worse. Pt is asking for a call back from Dr. Ninfa Linden. Please call pt at [phone].

## 2022-03-16 ENCOUNTER — Ambulatory Visit (INDEPENDENT_AMBULATORY_CARE_PROVIDER_SITE_OTHER): Payer: PRIVATE HEALTH INSURANCE | Admitting: Orthopaedic Surgery

## 2022-03-16 ENCOUNTER — Other Ambulatory Visit: Payer: Self-pay

## 2022-03-16 ENCOUNTER — Ambulatory Visit (INDEPENDENT_AMBULATORY_CARE_PROVIDER_SITE_OTHER): Payer: PRIVATE HEALTH INSURANCE

## 2022-03-16 ENCOUNTER — Encounter: Payer: Self-pay | Admitting: Orthopaedic Surgery

## 2022-03-16 DIAGNOSIS — M5416 Radiculopathy, lumbar region: Secondary | ICD-10-CM

## 2022-03-16 DIAGNOSIS — M545 Low back pain, unspecified: Secondary | ICD-10-CM

## 2022-03-16 DIAGNOSIS — Z96641 Presence of right artificial hip joint: Secondary | ICD-10-CM

## 2022-03-16 MED ORDER — TIZANIDINE HCL 4 MG PO TABS: 4.0000 mg | ORAL_TABLET | Freq: Three times a day (TID) | ORAL | 1 refills | Status: DC | PRN

## 2022-03-16 MED ORDER — CELECOXIB 200 MG PO CAPS: 200.0000 mg | ORAL_CAPSULE | Freq: Two times a day (BID) | ORAL | 1 refills | Status: DC | PRN

## 2022-03-16 MED ORDER — GABAPENTIN 300 MG PO CAPS: 300.0000 mg | ORAL_CAPSULE | Freq: Every day | ORAL | 1 refills | Status: DC

## 2022-03-16 NOTE — Progress Notes (Signed)
The patient is a 51 year old female who had a right total hip arthroplasty done through direct injure approach just in January.  Recently she has now been off of pain medication and muscle relaxant and she was started develop significant right thigh pain that was worsening.  Some of this is in the quad area as well and some around the hip.  She now describes some low back pain to the right side.  It does not go past her knee at all.  She is a diabetic so she cannot take steroids.  She is not taking any medications right now for this.  She walks without assistive device.  Her right operative hip moves smoothly and fluidly.  Her right knee move smoothly and fluidly but she does show weakness in her quads and pain in general in the thigh.  An AP pelvis lateral right hip shows no complicating features with a hip replacement.  Since she is not getting rest at night and we will try gabapentin as a nerve pain modulator.  I will extend her physical therapy to work on the low back and the right hip and thigh as well as quads.  We will try Celebrex and Zanaflex as an anti-inflammatory muscle relaxant.  I would like to see her back in 6 weeks to see how she is doing overall but no x-rays are needed.

## 2022-04-04 ENCOUNTER — Other Ambulatory Visit: Payer: Self-pay | Admitting: Orthopaedic Surgery

## 2022-04-29 ENCOUNTER — Ambulatory Visit: Payer: PRIVATE HEALTH INSURANCE | Admitting: Orthopaedic Surgery

## 2022-05-10 ENCOUNTER — Other Ambulatory Visit: Payer: Self-pay | Admitting: Orthopaedic Surgery

## 2022-06-07 ENCOUNTER — Other Ambulatory Visit: Payer: Self-pay | Admitting: Orthopaedic Surgery

## 2022-06-11 IMAGING — MR MR LUMBAR SPINE W/O CM
5 series · 31 of 48 positions shown · non-contrast
Comparison: None.

CLINICAL DATA: Low back pain.

EXAM:
MRI LUMBAR SPINE WITHOUT CONTRAST
TECHNIQUE: Multiplanar, multisequence MR imaging of the lumbar spine was
performed. No intravenous contrast was administered.

[Series 5: T2 · sagittal · 4.0mm · 0.68mm/px · 8 of 16 slices shown (1 of 2)]
[im 1/16]
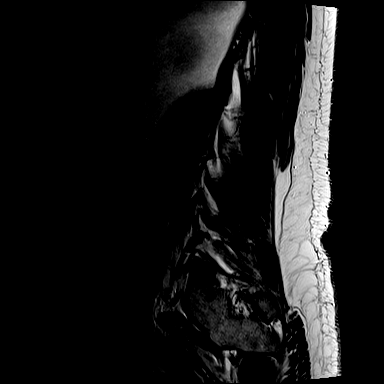
[im 3/16]
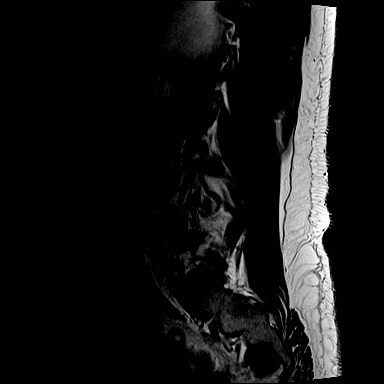
[im 5/16]
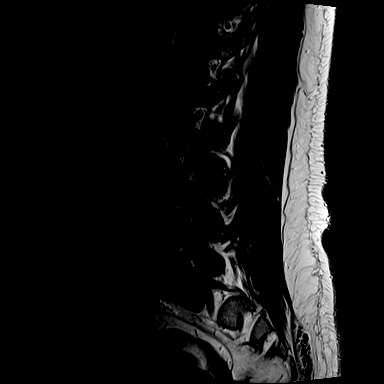
[im 7/16]
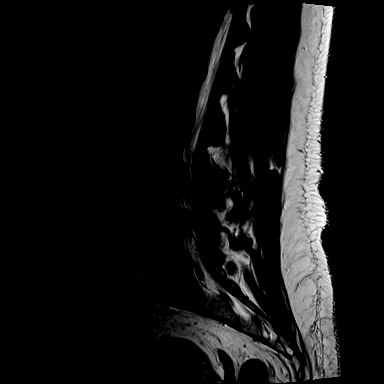
[im 9/16]
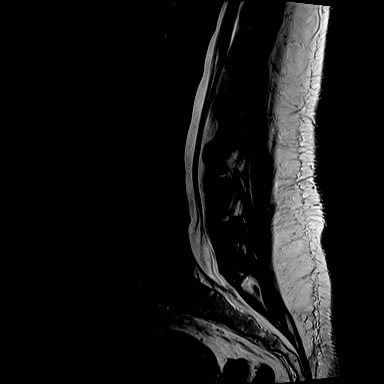
[im 11/16]
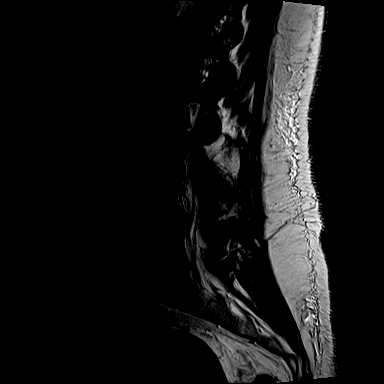
[im 13/16]
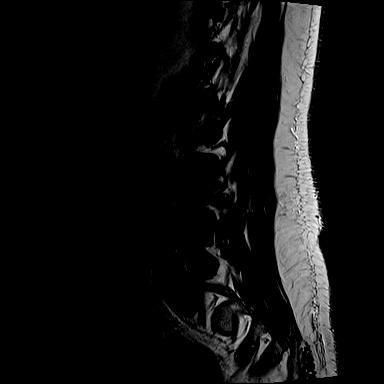
[im 16/16]
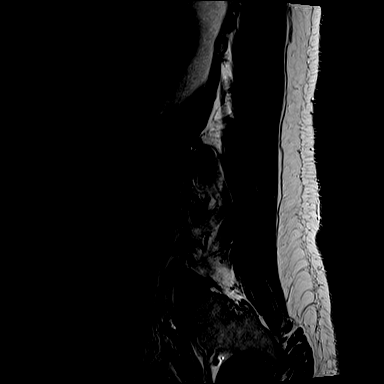

[Series 6: T1 · sagittal · 4.0mm · 0.81mm/px · 6 of 15 slices shown (1 of 2)]
[im 1/15]
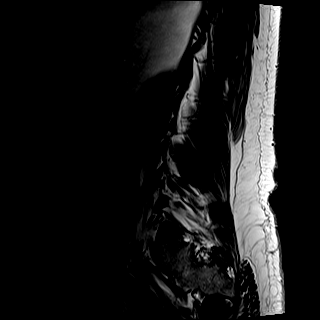
[im 3/15]
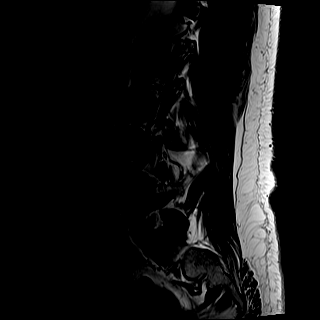
[im 6/15]
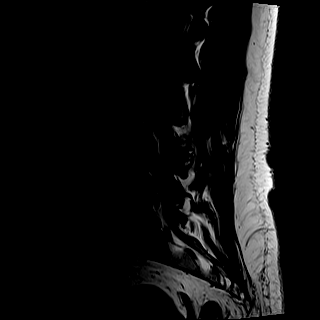
[im 9/15]
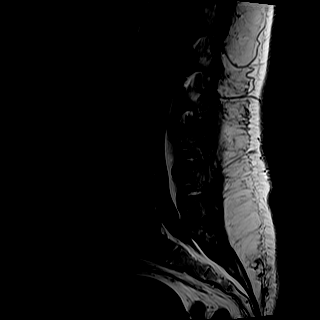
[im 12/15]
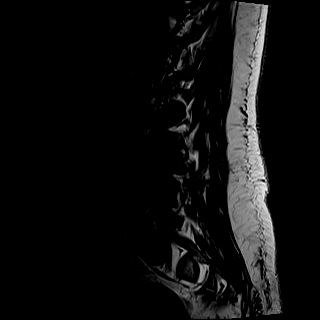
[im 15/15]
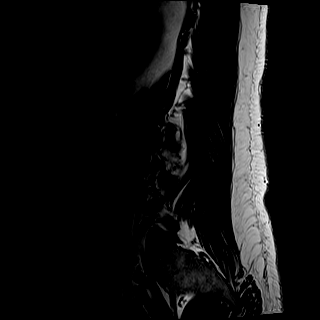

[Series 7: STIR · sagittal · 4.0mm · 0.51mm/px · 1 of 15 slices shown]
[im 1/15]
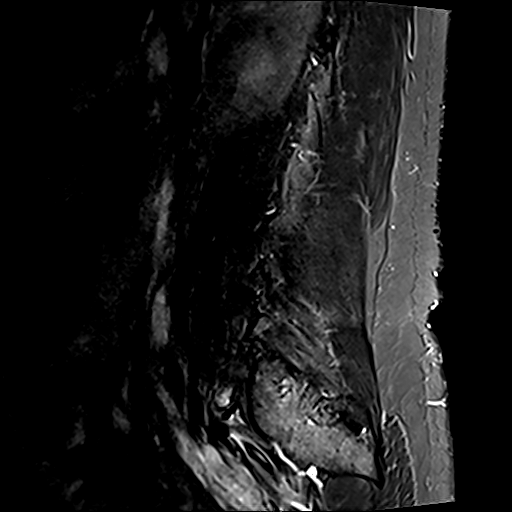

[Series 8: T2 · axial · 4.0mm · 0.70mm/px · z∈[-195,+19]mm · 8 of 35 slices shown (2 of 2)]
[im 1/35]
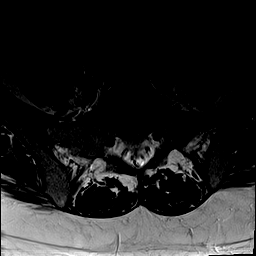
[im 6/35]
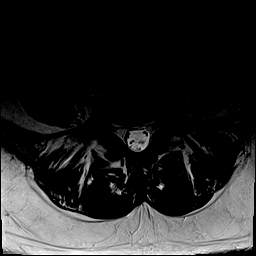
[im 11/35]
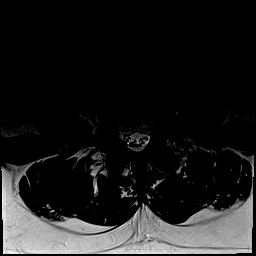
[im 16/35]
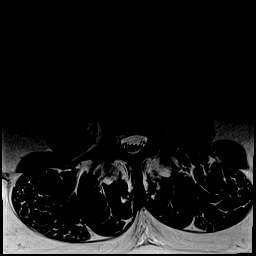
[im 19/35]
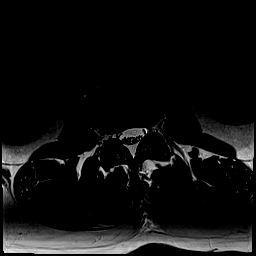
[im 24/35]
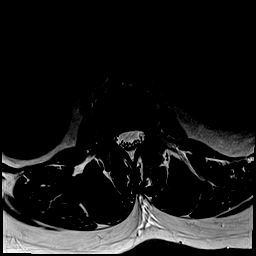
[im 29/35]
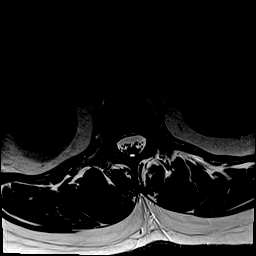
[im 35/35]
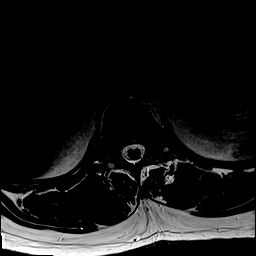

[Series 9: T1 · axial · 4.0mm · 0.35mm/px · z∈[-195,+19]mm · 8 of 35 slices shown (2 of 2)]
[im 1/35]
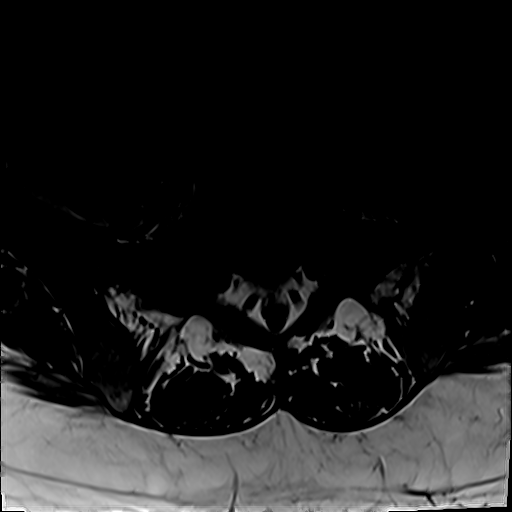
[im 6/35]
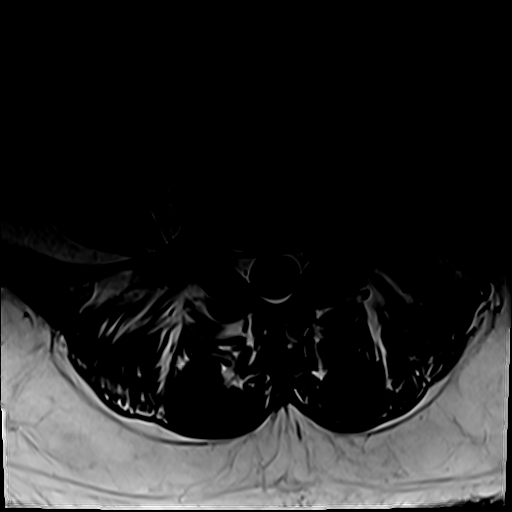
[im 11/35]
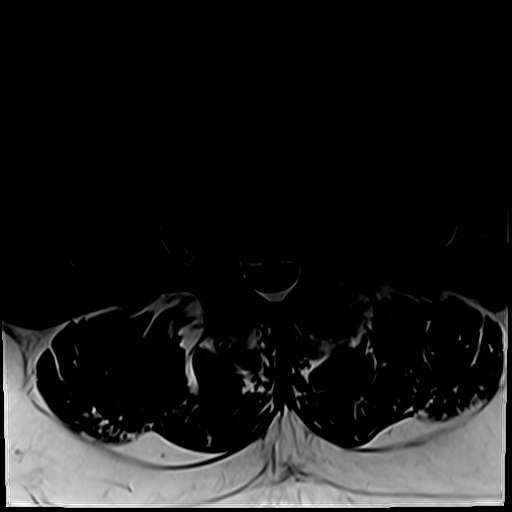
[im 16/35]
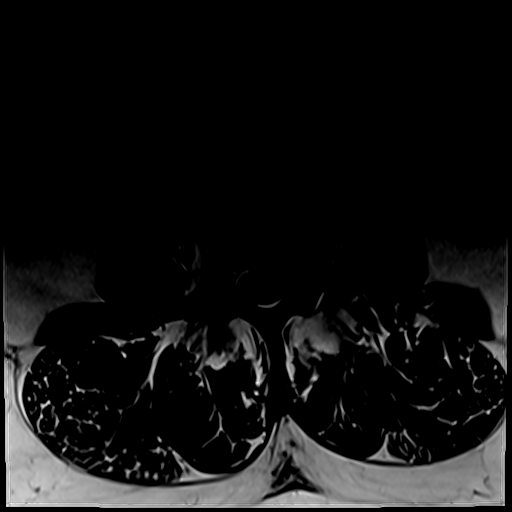
[im 19/35]
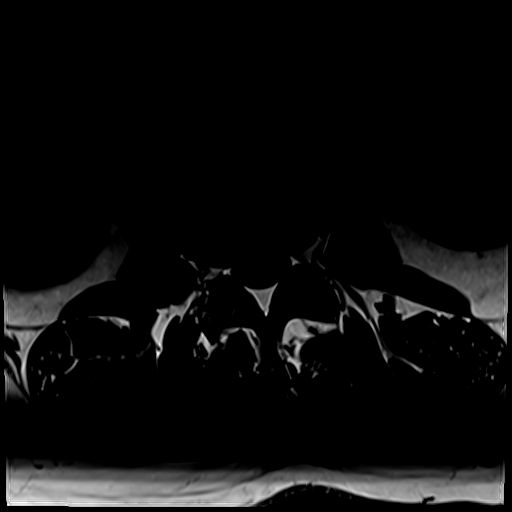
[im 24/35]
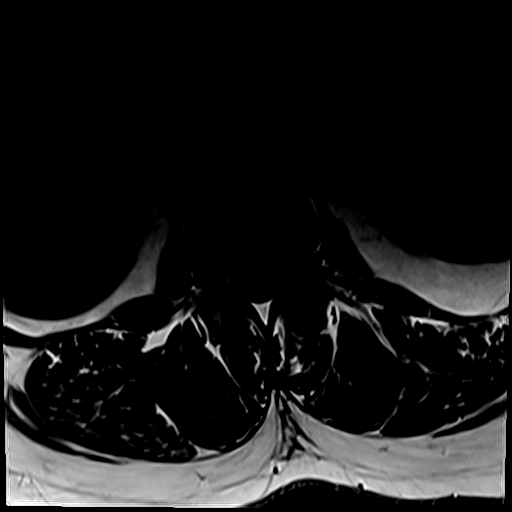
[im 29/35]
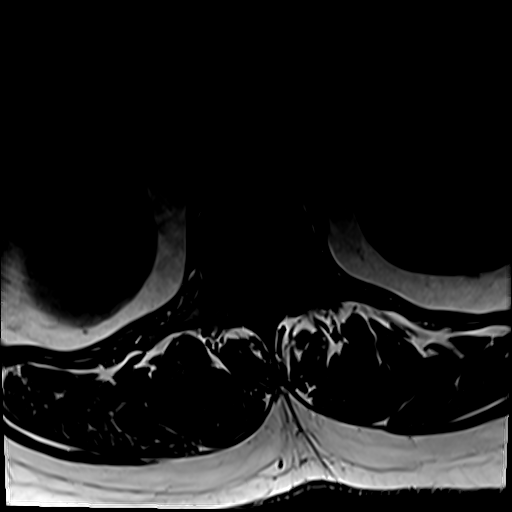
[im 35/35]
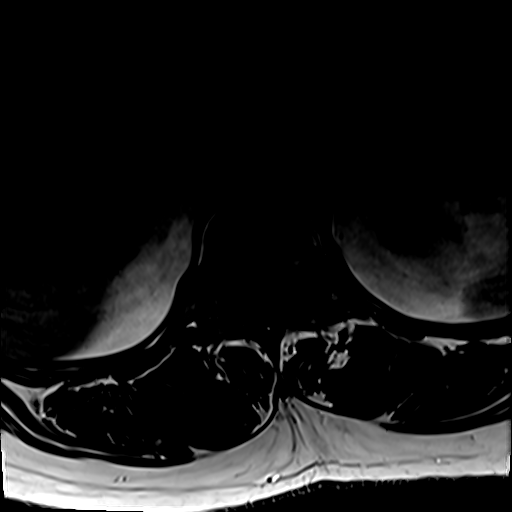

[31 of 48 positions shown; findings below may reference images not displayed]

FINDINGS: Segmentation:  Standard.

Alignment:  Minimal anterolisthesis of L3 over L4.

Vertebrae: No fracture, evidence of discitis, or bone lesion.
Endplate degenerative changes at L4-5. Large hemangioma within the
body and right L3 pedicle.

Conus medullaris and cauda equina: Conus extends to the L1 level.
Conus and cauda equina appear normal.

Paraspinal and other soft tissues: Negative.

Disc levels:

T12-L1: No spinal canal or neural foraminal stenosis.

L1-2: No spinal canal or neural foraminal stenosis.

L2-3: Facet degenerative changes. No spinal canal or neural
foraminal stenosis.

L3-4: Disc bulge with prominent hypertrophic facet degenerative
changes and ligamentum flavum redundancy resulting in mild bilateral
neural foraminal narrowing. No significant spinal canal stenosis.

L4-5: Disc bulge, moderate facet degenerative changes and ligamentum
flavum redundancy resulting in mild narrowing of the bilateral
subarticular zones, mild-to-moderate right and moderate left neural
foraminal narrowing.

L5-S1: Mild facet degenerative changes. No spinal canal or neural
foraminal stenosis.
IMPRESSION: 1. Mild to moderate degenerative changes of the lumbar spine, more
pronounced at the level of the facet joints at L3-4.
2. Mild narrowing of the bilateral subarticular zones,
mild-to-moderate right and moderate left neural foraminal narrowing
at L4-5.

## 2022-06-21 ENCOUNTER — Ambulatory Visit: Payer: PRIVATE HEALTH INSURANCE | Admitting: Orthopaedic Surgery

## 2022-06-29 ENCOUNTER — Other Ambulatory Visit (HOSPITAL_COMMUNITY): Payer: Self-pay | Admitting: Family Medicine

## 2022-06-29 DIAGNOSIS — Z1231 Encounter for screening mammogram for malignant neoplasm of breast: Secondary | ICD-10-CM

## 2022-07-07 ENCOUNTER — Ambulatory Visit (INDEPENDENT_AMBULATORY_CARE_PROVIDER_SITE_OTHER): Payer: PRIVATE HEALTH INSURANCE | Admitting: Orthopaedic Surgery

## 2022-07-07 ENCOUNTER — Encounter: Payer: Self-pay | Admitting: Orthopaedic Surgery

## 2022-07-07 DIAGNOSIS — Z96641 Presence of right artificial hip joint: Secondary | ICD-10-CM

## 2022-07-07 NOTE — Progress Notes (Signed)
The patient is now a month status post a right total hip arthroplasty.  She says she is doing well overall and has no complaints with good range of motion and strength.  She is only 51 years old.  She walks without any significant limp.  Her right operative hip moves smoothly and fluidly.  Her leg lengths appear equal.  We will see her back at the 1 year standpoint of surgery in January 2024.  At that visit we will have a standing low AP pelvis and lateral of the right operative hip.  If there are issues before then she will let us know.

## 2022-08-12 ENCOUNTER — Ambulatory Visit (HOSPITAL_COMMUNITY)
Admission: RE | Admit: 2022-08-12 | Discharge: 2022-08-12 | Disposition: A | Discharge status: Home / Self Care | Payer: PRIVATE HEALTH INSURANCE | Source: Ambulatory Visit | Attending: Family Medicine | Admitting: Family Medicine

## 2022-08-12 DIAGNOSIS — Z1231 Encounter for screening mammogram for malignant neoplasm of breast: Secondary | ICD-10-CM | POA: Diagnosis present

## 2022-08-24 ENCOUNTER — Other Ambulatory Visit: Payer: Self-pay | Admitting: Orthopaedic Surgery

## 2022-10-12 ENCOUNTER — Ambulatory Visit (INDEPENDENT_AMBULATORY_CARE_PROVIDER_SITE_OTHER): Payer: PRIVATE HEALTH INSURANCE | Admitting: Family Medicine

## 2022-10-12 VITALS — BP 142/88 | HR 94 | Temp 98.2°F | Ht 68.0 in | Wt 218.8 lb

## 2022-10-12 DIAGNOSIS — D649 Anemia, unspecified: Secondary | ICD-10-CM

## 2022-10-12 DIAGNOSIS — Z862 Personal history of diseases of the blood and blood-forming organs and certain disorders involving the immune mechanism: Secondary | ICD-10-CM | POA: Diagnosis not present

## 2022-10-12 DIAGNOSIS — E119 Type 2 diabetes mellitus without complications: Secondary | ICD-10-CM | POA: Diagnosis not present

## 2022-10-12 DIAGNOSIS — Z1211 Encounter for screening for malignant neoplasm of colon: Secondary | ICD-10-CM

## 2022-10-12 DIAGNOSIS — Z0000 Encounter for general adult medical examination without abnormal findings: Secondary | ICD-10-CM | POA: Diagnosis not present

## 2022-10-12 DIAGNOSIS — E785 Hyperlipidemia, unspecified: Secondary | ICD-10-CM

## 2022-10-12 NOTE — Patient Instructions (Addendum)
Labs today.  Continue your medications.  Check BP regularly at home. Watch salt intake.   Take care  Dr. Lacinda Axon

## 2022-10-13 ENCOUNTER — Encounter: Payer: Self-pay | Admitting: Family Medicine

## 2022-10-13 DIAGNOSIS — Z0000 Encounter for general adult medical examination without abnormal findings: Secondary | ICD-10-CM

## 2022-10-13 HISTORY — DX: Encounter for general adult medical examination without abnormal findings: Z00.00

## 2022-10-13 NOTE — Assessment & Plan Note (Signed)
Referral placed for colonoscopy. Advised that she can get her shingles vaccination at her local pharmacy. Needs labs today.  Labs have been ordered. Blood pressure mildly elevated today.  Advised to monitor her blood pressure closely at home.  If continues to be elevated will need additional medication.  Weight loss will likely help.  Patient will follow-up in 3 months regarding her blood pressure.

## 2022-10-13 NOTE — Progress Notes (Signed)
Subjective:  Patient ID: Tracy Kelley, female    DOB: 1971/01/22  Age: 52 y.o. MRN: [id number]  CC: Chief Complaint  Patient presents with   Annual Exam    HPI:  51 year old female with the below mentioned medical problems presents for an annual physical exam.  Patient states that overall she is doing well.  No reports of chest pain or shortness of breath.  BP elevated today.  Preventative Healthcare Pap smear: Up to date. Mammogram: Up to date. Colonoscopy: Has never had screening colonoscopy.  Will discuss today. Immunizations Tetanus - Up to date.  Flu - Up to date.  Zoster - Candidate for. Advised that she can get this at her local pharmacy. Hepatitis C screening - Declines. Labs: Needs labs. Alcohol use: No. Smoking/tobacco use: No.  STD/HIV testing: HIV screening completed.  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/10/2021   Dense breasts 10/10/2021   Foraminal stenosis of lumbar region 12/02/2020   Diabetes mellitus type 2 in nonobese [facility]) 11/05/2016   Essential hypertension 11/05/2016    Social Hx   Social History   Socioeconomic History   Marital status: Divorced    Spouse name: Not on file   Number of children: 4   Years of education: 13   Highest education level: Not on file  Occupational History   Occupation: cust service    Employer: [employer]  Tobacco Use   Smoking status: Never   Smokeless tobacco: Never  Vaping Use   Vaping Use: Never used  Substance and Sexual Activity   Alcohol use: Not Currently    Alcohol/week: 3.0 standard drinks of alcohol    Types: 3 Glasses of wine per week    Comment: occasional wine   Drug use: No   Sexual activity: Yes    Birth control/protection: Post-menopausal    Comment: separated  Other Topics Concern   Not on file  Social History Narrative   Separated   Works for [employer]   4 boys   2 at home   Social Determinants of Radio broadcast assistant Strain: Not on file  Food Insecurity: Not on file  Transportation Needs: Not on file  Physical Activity: Not on file  Stress: Not on file  Social Connections: Not on file    Review of Systems Per HPI  Objective:  BP (!) 142/88   Pulse 94   Temp 98.2 F (36.8 C) (Oral)   Ht _0  (1.727 m)   Wt 218 lb 12.8 oz (99.2 kg)   LMP 03/26/2019   SpO2 96%   BMI 33.27 kg/m      10/12/2022    1:44 PM 03/01/2022    2:00 PM 11/11/2021    9:06 AM  BP/Weight  Systolic BP 585 929 244  Diastolic BP 88 74 73  Wt. (Lbs) 218.8 206.4   BMI 33.27 kg/m2 30.48 kg/m2     Physical Exam Vitals reviewed.  Constitutional:      General: She is not in acute distress.    Appearance: She is well-developed.  HENT:     Head: Normocephalic and atraumatic.     Nose: Nose normal.     Mouth/Throat:     Pharynx: No oropharyngeal exudate.  Eyes:     General: No scleral icterus.    Conjunctiva/sclera: Conjunctivae normal.  Neck:  Thyroid: No thyromegaly.  Cardiovascular:     Rate and Rhythm: Normal rate and regular rhythm.     Heart sounds: No murmur heard. Pulmonary:     Effort: Pulmonary effort is normal.     Breath sounds: Normal breath sounds. No wheezing or rales.  Abdominal:     General: There is no distension.     Palpations: Abdomen is soft.     Tenderness: There is no abdominal tenderness. There is no guarding or rebound.  Musculoskeletal:        General: Normal range of motion.     Cervical back: Neck supple.  Lymphadenopathy:     Cervical: No cervical adenopathy.  Skin:    General: Skin is warm and dry.     Findings: No rash.  Neurological:     Mental Status: She is alert and oriented to person, place, and time.  Psychiatric:        Mood and Affect: Mood normal.        Behavior: Behavior normal.    Lab Results  Component Value Date   WBC 8.0 11/11/2021   HGB 9.0 (L) 11/11/2021    HCT 27.7 (L) 11/11/2021   PLT 316 11/11/2021   GLUCOSE 99 11/11/2021   CHOL 189 01/14/2021   TRIG 337 (H) 01/14/2021   HDL 37 (L) 01/14/2021   LDLCALC 96 01/14/2021   ALT 11 07/08/2021   AST 16 07/08/2021   NA 135 11/11/2021   K 3.4 (L) 11/11/2021   CL 101 11/11/2021   CREATININE 1.21 (H) 11/11/2021   BUN 13 11/11/2021   CO2 28 11/11/2021   TSH 1.860 01/14/2021   HGBA1C 6.1 (H) 11/06/2021   MICROALBUR 1.0 05/11/2017     Assessment & Plan:   Problem List Items Addressed This Visit       Endocrine   Diabetes mellitus type 2 in nonobese ([place])   Relevant Orders   CMP14+EGFR   Hemoglobin A1c   Microalbumin / creatinine urine ratio     Other   Annual physical exam - Primary    Referral placed for colonoscopy. Advised that she can get her shingles vaccination at her local pharmacy. Needs labs today.  Labs have been ordered. Blood pressure mildly elevated today.  Advised to monitor her blood pressure closely at home.  If continues to be elevated will need additional medication.  Weight loss will likely help.  Patient will follow-up in 3 months regarding her blood pressure.      Hyperlipidemia   Relevant Orders   Lipid panel   Other Visit Diagnoses     History of anemia       Relevant Orders   CBC   Encounter for screening colonoscopy       Relevant Orders   Ambulatory referral to Gastroenterology      Follow-up:  Return in about 3 months (around 01/11/2023) for HTN follow up.  [place]

## 2022-10-14 ENCOUNTER — Encounter: Payer: PRIVATE HEALTH INSURANCE | Admitting: Family Medicine

## 2022-10-14 ENCOUNTER — Encounter: Payer: Self-pay | Admitting: *Deleted

## 2022-10-15 LAB — CBC
Hematocrit: 34.8 % (ref 34.0–46.6)
Hemoglobin: 10.9 g/dL — ABNORMAL LOW (ref 11.1–15.9)
MCH: 24 pg — ABNORMAL LOW (ref 26.6–33.0)
MCHC: 31.3 g/dL — ABNORMAL LOW (ref 31.5–35.7)
MCV: 77 fL — ABNORMAL LOW (ref 79–97)
Platelets: 426 10*3/uL (ref 150–450)
RBC: 4.54 x10E6/uL (ref 3.77–5.28)
RDW: 17.8 % — ABNORMAL HIGH (ref 11.7–15.4)
WBC: 8.8 10*3/uL (ref 3.4–10.8)

## 2022-10-15 LAB — HEMOGLOBIN A1C
Est. average glucose Bld gHb Est-mCnc: 146 mg/dL
Hgb A1c MFr Bld: 6.7 % — ABNORMAL HIGH (ref 4.8–5.6)

## 2022-10-15 LAB — CMP14+EGFR
ALT: 11 IU/L (ref 0–32)
AST: 16 IU/L (ref 0–40)
Albumin/Globulin Ratio: 1.4 (ref 1.2–2.2)
Albumin: 4.5 g/dL (ref 3.9–4.9)
Alkaline Phosphatase: 90 IU/L (ref 44–121)
BUN/Creatinine Ratio: 10 (ref 9–23)
BUN: 12 mg/dL (ref 6–24)
Bilirubin Total: 0.3 mg/dL (ref 0.0–1.2)
CO2: 26 mmol/L (ref 20–29)
Calcium: 11 mg/dL — ABNORMAL HIGH (ref 8.7–10.2)
Chloride: 99 mmol/L (ref 96–106)
Creatinine, Ser: 1.17 mg/dL — ABNORMAL HIGH (ref 0.57–1.00)
Globulin, Total: 3.3 g/dL (ref 1.5–4.5)
Glucose: 76 mg/dL (ref 70–99)
Potassium: 3.7 mmol/L (ref 3.5–5.2)
Sodium: 138 mmol/L (ref 134–144)
Total Protein: 7.8 g/dL (ref 6.0–8.5)
eGFR: 57 mL/min/{1.73_m2} — ABNORMAL LOW (ref >59)

## 2022-10-15 LAB — LIPID PANEL
Chol/HDL Ratio: 4.5 ratio — ABNORMAL HIGH (ref 0.0–4.4)
Cholesterol, Total: 197 mg/dL (ref 100–199)
HDL: 44 mg/dL (ref >39)
LDL Chol Calc (NIH): 134 mg/dL — ABNORMAL HIGH (ref 0–99)
Triglycerides: 107 mg/dL (ref 0–149)
VLDL Cholesterol Cal: 19 mg/dL (ref 5–40)

## 2022-10-15 LAB — MICROALBUMIN / CREATININE URINE RATIO
Creatinine, Urine: 153.8 mg/dL
Microalb/Creat Ratio: 4 mg/g creat (ref 0–29)
Microalbumin, Urine: 6.2 ug/mL

## 2022-10-15 NOTE — Addendum Note (Signed)
Addended by: Dairl Ponder on: 10/15/2022 02:02 PM   Modules accepted: Orders

## 2022-10-18 ENCOUNTER — Other Ambulatory Visit: Payer: Self-pay | Admitting: Family Medicine

## 2022-10-18 MED ORDER — ROSUVASTATIN CALCIUM 10 MG PO TABS: 10.0000 mg | ORAL_TABLET | Freq: Every day | ORAL | 3 refills | Status: DC

## 2022-10-19 ENCOUNTER — Other Ambulatory Visit: Payer: Self-pay | Admitting: Family Medicine

## 2022-10-19 DIAGNOSIS — E119 Type 2 diabetes mellitus without complications: Secondary | ICD-10-CM

## 2022-10-19 DIAGNOSIS — E785 Hyperlipidemia, unspecified: Secondary | ICD-10-CM

## 2022-10-19 DIAGNOSIS — I10 Essential (primary) hypertension: Secondary | ICD-10-CM

## 2022-10-21 ENCOUNTER — Other Ambulatory Visit: Payer: Self-pay | Admitting: Family Medicine

## 2022-10-22 LAB — SPECIMEN STATUS REPORT

## 2022-10-24 LAB — IRON,TIBC AND FERRITIN PANEL
Ferritin: 33 ng/mL (ref 15–150)
Iron Saturation: 9 % — CL (ref 15–55)
Iron: 57 ug/dL (ref 27–159)
Total Iron Binding Capacity: 601 ug/dL (ref 250–450)
UIBC: 544 ug/dL — ABNORMAL HIGH (ref 131–425)

## 2022-10-24 LAB — PTH-RELATED PEPTIDE: PTH-related peptide: <2 pmol/L

## 2022-10-24 LAB — CALCIUM, IONIZED: Calcium, Ion: 6 mg/dL — ABNORMAL HIGH (ref 4.5–5.6)

## 2022-10-26 ENCOUNTER — Other Ambulatory Visit: Payer: Self-pay | Admitting: Family Medicine

## 2022-10-26 DIAGNOSIS — D509 Iron deficiency anemia, unspecified: Secondary | ICD-10-CM

## 2022-10-28 ENCOUNTER — Other Ambulatory Visit: Payer: Self-pay | Admitting: Orthopaedic Surgery

## 2022-11-10 ENCOUNTER — Ambulatory Visit: Payer: PRIVATE HEALTH INSURANCE | Admitting: Orthopaedic Surgery

## 2022-11-11 NOTE — Patient Instructions (Signed)
  Procedure: Colonoscopy  Estimated body mass index is 31.01 kg/m as calculated from the following:   Height as of this encounter: '5\' 9"'$  (1.753 m).   Weight as of this encounter: 210 lb (95.3 kg).  Have you had a colonoscopy before?  No  Do you have family history of colon cancer?  No  Do you have a family history of polyps? No  Previous colonoscopy with polyps removed? No  Do you have a history colorectal cancer?   No  Are you diabetic?  Yes, Type 2  Do you have a prosthetic or mechanical heart valve? No  Do you have a pacemaker/defibrillator?   No  Have you had endocarditis/atrial fibrillation?  No  Do you use supplemental oxygen/CPAP?  No  Have you had joint replacement within the last 12 months?  Yes  Do you tend to be constipated or have to use laxatives?  No   Do you have history of alcohol use? If yes, how much and how often.  No  Do you have history or are you using drugs? If yes, what do are you  using?  No  Have you ever had a stroke/heart attack?  No  Have you ever had a heart or other vascular stent placed? No  Do you take weight loss medication? No  female patients,: have you had a hysterectomy? No                              are you post menopausal?  Yes                              do you still have your menstrual cycle? No    Date of last menstrual period? Unknown  Do you take any blood-thinning medications such as: (Plavix, aspirin, Coumadin, Aggrenox, Brilinta, Xarelto, Eliquis, Pradaxa, Savaysa or Effient)? No  If yes we need the name, milligram, dosage and who is prescribing doctor:  N/A             Current Outpatient Medications  Medication Sig Dispense Refill   amLODipine (NORVASC) 10 MG tablet TAKE 1 TABLET(10 MG) BY MOUTH DAILY 90 tablet 1   fenofibrate 160 MG tablet TAKE 1 TABLET(160 MG) BY MOUTH DAILY 90 tablet 1   glipiZIDE (GLUCOTROL) 5 MG tablet TAKE 1 TABLET(5 MG) BY MOUTH DAILY 90 tablet 1   glucose blood test strip Use to test  blood sugar once a day Dx:E11.9 100 each 5   hydrochlorothiazide (HYDRODIURIL) 25 MG tablet TAKE 1 TABLET(25 MG) BY MOUTH DAILY 90 tablet 1   Lancets 30G MISC Please dispense lancets to go with patient's blood glucose monitor 50 each 0   rosuvastatin (CRESTOR) 10 MG tablet Take 1 tablet (10 mg total) by mouth daily. 90 tablet 3   No current facility-administered medications for this visit.    Allergies  Allergen Reactions   Metformin And Related Nausea Only    GI intolerance nausea and diarreha
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
## 2022-11-18 ENCOUNTER — Encounter: Payer: Self-pay | Admitting: Hematology

## 2022-11-18 ENCOUNTER — Other Ambulatory Visit (HOSPITAL_COMMUNITY): Admission: RE | Admit: 2022-11-18 | Payer: PRIVATE HEALTH INSURANCE | Source: Ambulatory Visit

## 2022-11-18 ENCOUNTER — Inpatient Hospital Stay: Payer: PRIVATE HEALTH INSURANCE | Attending: Hematology | Admitting: Hematology

## 2022-11-18 VITALS — BP 124/91 | HR 96 | Temp 98.6°F | Resp 18 | Ht 69.0 in | Wt 219.0 lb

## 2022-11-18 DIAGNOSIS — N1831 Chronic kidney disease, stage 3a: Secondary | ICD-10-CM

## 2022-11-18 DIAGNOSIS — R5383 Other fatigue: Secondary | ICD-10-CM | POA: Diagnosis not present

## 2022-11-18 DIAGNOSIS — D509 Iron deficiency anemia, unspecified: Secondary | ICD-10-CM

## 2022-11-18 DIAGNOSIS — F5089 Other specified eating disorder: Secondary | ICD-10-CM

## 2022-11-18 DIAGNOSIS — D649 Anemia, unspecified: Secondary | ICD-10-CM

## 2022-11-18 DIAGNOSIS — Z803 Family history of malignant neoplasm of breast: Secondary | ICD-10-CM | POA: Insufficient documentation

## 2022-11-18 DIAGNOSIS — Z8041 Family history of malignant neoplasm of ovary: Secondary | ICD-10-CM | POA: Diagnosis not present

## 2022-11-18 DIAGNOSIS — Z8049 Family history of malignant neoplasm of other genital organs: Secondary | ICD-10-CM | POA: Diagnosis not present

## 2022-11-18 DIAGNOSIS — Z87891 Personal history of nicotine dependence: Secondary | ICD-10-CM | POA: Insufficient documentation

## 2022-11-18 DIAGNOSIS — I129 Hypertensive chronic kidney disease with stage 1 through stage 4 chronic kidney disease, or unspecified chronic kidney disease: Secondary | ICD-10-CM | POA: Diagnosis not present

## 2022-11-18 DIAGNOSIS — E1122 Type 2 diabetes mellitus with diabetic chronic kidney disease: Secondary | ICD-10-CM

## 2022-11-18 NOTE — Patient Instructions (Signed)
[facility] **   You were seen today by Dr. Delton Coombes & Tarri Abernethy PA-C for your iron deficiency anemia.    IRON DEFICIENCY ANEMIA Your labs from December show iron deficiency is the most likely cause of your anemia. Please start taking iron tablet once daily, as per the instructions below. We will recheck your labs in 8 weeks to see if your blood and iron levels have improved, and to look for other causes of anemia. Continue to follow-up with gastroenterology (stomach and intestine doctors) for EGD and colonoscopy, so that they can make sure you do not have any signs of bleeding from your stomach/intestines.   Start taking iron tablet once daily. Take over-the-counter ferrous sulfate 325 mg once daily. Take your iron in the morning if possible (unless is interacts with other medications such as antacids). Take your iron pill with a glass of orange juice to help your body absorb it better. If you take any antacid or acid reflux medicine at home, take your iron at a different time of day, separated by at least 2 hours from your antacid medication. If you experience any constipation from your iron tablet, try taking over the stool softener such as Colace once daily.  Drink plenty of water.  Eat a high fiber diet and consider taking a fiber supplement. If you continue to have severe side effects such as constipation or upset stomach, you can decrease your iron to every other day instead. If you are still having severe side effects at lower dose, please stop taking your iron tablet and call our office to let us know.   LABS: Return in 8 weeks for labs  FOLLOW-UP APPOINTMENT: Office visit in about 2 months (1 week after labs)  ** Thank you for trusting me with your healthcare!  I strive to provide all of my patients with quality care at each visit.  If you receive a survey for this visit, I would be so grateful  to you for taking the time to provide feedback.  Thank you in advance!  ~ Shunta Mclaurin                   Dr. Derek Jack   &   Tarri Abernethy, PA-C   - - - - - - - - - - - - - - - - - -    Thank you for choosing [facility] at [facility] to provide your oncology and hematology care.  To afford each patient quality time with our provider, please arrive at least 15 minutes before your scheduled appointment time.   If you have a lab appointment with the [facility] please come in thru the Main Entrance and check in at the main information desk.  You need to re-schedule your appointment should you arrive 10 or more minutes late.  We strive to give you quality time with our providers, and arriving late affects you and other patients whose appointments are after yours.  Also, if you no show three or more times for appointments you may be dismissed from the clinic at the providers discretion.     Again, thank you for choosing [facility].  Our hope is that these requests will decrease the amount of time that you wait before being seen by our physicians.       _____________________________________________________________  Should you have questions after your visit to [facility], please contact our office at [phone] and follow the prompts.  Our office hours are 8:00 a.m. and 4:30 p.m. Monday - Friday.  Please note that voicemails left after 4:00 p.m. may not be returned until the following business day.  We are closed weekends and major holidays.  You do have access to a nurse 24-7, just call the main number to the clinic [phone] and do not press any options, hold on the line and a nurse will answer the phone.    For prescription refill requests, have your pharmacy contact our office and allow 72 hours.

## 2022-11-24 ENCOUNTER — Ambulatory Visit: Payer: PRIVATE HEALTH INSURANCE | Admitting: Orthopaedic Surgery

## 2022-11-25 ENCOUNTER — Encounter (INDEPENDENT_AMBULATORY_CARE_PROVIDER_SITE_OTHER): Payer: Self-pay | Admitting: *Deleted

## 2022-11-25 ENCOUNTER — Other Ambulatory Visit (INDEPENDENT_AMBULATORY_CARE_PROVIDER_SITE_OTHER): Payer: Self-pay | Admitting: *Deleted

## 2022-11-25 DIAGNOSIS — Z1211 Encounter for screening for malignant neoplasm of colon: Secondary | ICD-10-CM

## 2022-11-25 MED ORDER — PEG 3350-KCL-NA BICARB-NACL 420 G PO SOLR: 4000.0000 mL | Freq: Once | ORAL | 0 refills | Status: AC

## 2022-11-25 NOTE — Progress Notes (Signed)
CALLED PT. She has been scheduled with Dr. Abbey Chatters 2/23 at 12:30pm. Aware will need lab work prior. She requested to go to [facility]. Order placed. Aware will send rx for prep to pharmacy ([facility] [address]). Also has access to mychart so will send instructions to her mychart.

## 2022-11-25 NOTE — Progress Notes (Signed)
Called [facility] and spoke with Marquette Old. No PA required for procedure

## 2022-11-25 NOTE — Progress Notes (Signed)
ASA 2. Needs BMP prior as on HCTZ. No glipizide morning of procedure.

## 2022-11-29 NOTE — Progress Notes (Signed)
Referral completed

## 2022-12-03 LAB — HM DIABETES EYE EXAM

## 2022-12-15 ENCOUNTER — Encounter: Payer: Self-pay | Admitting: *Deleted

## 2022-12-15 ENCOUNTER — Telehealth: Payer: Self-pay | Admitting: *Deleted

## 2022-12-15 NOTE — Telephone Encounter (Signed)
Pt called and said she has not gotten instructions for her prep. Her procedure is Friday. Instructions sent to pt via MyChart.

## 2022-12-16 LAB — BASIC METABOLIC PANEL
BUN/Creatinine Ratio: 15 (ref 9–23)
BUN: 18 mg/dL (ref 6–24)
CO2: 23 mmol/L (ref 20–29)
Calcium: 11 mg/dL — ABNORMAL HIGH (ref 8.7–10.2)
Chloride: 97 mmol/L (ref 96–106)
Creatinine, Ser: 1.21 mg/dL — ABNORMAL HIGH (ref 0.57–1.00)
Glucose: 95 mg/dL (ref 70–99)
Potassium: 3.7 mmol/L (ref 3.5–5.2)
Sodium: 139 mmol/L (ref 134–144)
eGFR: 55 mL/min/{1.73_m2} — ABNORMAL LOW (ref >59)

## 2022-12-17 ENCOUNTER — Encounter (HOSPITAL_COMMUNITY): Payer: Self-pay

## 2022-12-17 ENCOUNTER — Ambulatory Visit (HOSPITAL_COMMUNITY): Payer: PRIVATE HEALTH INSURANCE | Admitting: Anesthesiology

## 2022-12-17 ENCOUNTER — Ambulatory Visit (HOSPITAL_COMMUNITY)
Admission: RE | Admit: 2022-12-17 | Discharge: 2022-12-17 | Disposition: A | Discharge status: Home / Self Care | Payer: PRIVATE HEALTH INSURANCE | Attending: Internal Medicine | Admitting: Internal Medicine

## 2022-12-17 ENCOUNTER — Encounter (HOSPITAL_COMMUNITY)
Admission: RE | Disposition: A | Discharge status: Home / Self Care | Payer: Self-pay | Source: Home / Self Care | Attending: Internal Medicine

## 2022-12-17 ENCOUNTER — Other Ambulatory Visit: Payer: Self-pay

## 2022-12-17 DIAGNOSIS — Z1211 Encounter for screening for malignant neoplasm of colon: Secondary | ICD-10-CM | POA: Insufficient documentation

## 2022-12-17 DIAGNOSIS — K648 Other hemorrhoids: Secondary | ICD-10-CM

## 2022-12-17 DIAGNOSIS — Z79899 Other long term (current) drug therapy: Secondary | ICD-10-CM | POA: Diagnosis not present

## 2022-12-17 DIAGNOSIS — Z833 Family history of diabetes mellitus: Secondary | ICD-10-CM | POA: Diagnosis not present

## 2022-12-17 DIAGNOSIS — Z87891 Personal history of nicotine dependence: Secondary | ICD-10-CM | POA: Diagnosis not present

## 2022-12-17 DIAGNOSIS — E119 Type 2 diabetes mellitus without complications: Secondary | ICD-10-CM | POA: Diagnosis not present

## 2022-12-17 DIAGNOSIS — Z8249 Family history of ischemic heart disease and other diseases of the circulatory system: Secondary | ICD-10-CM | POA: Diagnosis not present

## 2022-12-17 DIAGNOSIS — D120 Benign neoplasm of cecum: Secondary | ICD-10-CM

## 2022-12-17 DIAGNOSIS — Z7984 Long term (current) use of oral hypoglycemic drugs: Secondary | ICD-10-CM | POA: Insufficient documentation

## 2022-12-17 DIAGNOSIS — I10 Essential (primary) hypertension: Secondary | ICD-10-CM | POA: Diagnosis not present

## 2022-12-17 HISTORY — PX: COLONOSCOPY WITH PROPOFOL: SHX5780

## 2022-12-17 HISTORY — PX: POLYPECTOMY: SHX5525

## 2022-12-17 LAB — GLUCOSE, CAPILLARY: Glucose-Capillary: 124 mg/dL — ABNORMAL HIGH (ref 70–99)

## 2022-12-17 SURGERY — COLONOSCOPY WITH PROPOFOL
Anesthesia: General

## 2022-12-17 MED ORDER — LIDOCAINE HCL (CARDIAC) PF 100 MG/5ML IV SOSY
PREFILLED_SYRINGE | INTRAVENOUS | Status: DC | PRN
  Administered 2022-12-17: 50 mg via INTRAVENOUS

## 2022-12-17 MED ORDER — LACTATED RINGERS IV SOLN
INTRAVENOUS | Status: DC
  Administered 2022-12-17: 1000 mL via INTRAVENOUS

## 2022-12-17 MED ORDER — PROPOFOL 10 MG/ML IV BOLUS
INTRAVENOUS | Status: DC | PRN
  Administered 2022-12-17: 50 mg via INTRAVENOUS
  Administered 2022-12-17: 30 mg via INTRAVENOUS
  Administered 2022-12-17 (×2): 100 mg via INTRAVENOUS
  Administered 2022-12-17: 50 mg via INTRAVENOUS

## 2022-12-17 NOTE — Anesthesia Postprocedure Evaluation (Signed)
Anesthesia Post Note  Patient: Tracy Kelley  Procedure(s) Performed: COLONOSCOPY WITH PROPOFOL POLYPECTOMY  Patient location during evaluation: Phase II Anesthesia Type: General Level of consciousness: awake and alert and oriented Pain management: pain level controlled Vital Signs Assessment: post-procedure vital signs reviewed and stable Respiratory status: spontaneous breathing, nonlabored ventilation and respiratory function stable Cardiovascular status: blood pressure returned to baseline and stable Postop Assessment: no apparent nausea or vomiting Anesthetic complications: no  No notable events documented.   Last Vitals:  Vitals:   12/17/22 1112 12/17/22 1322  BP: (!) 143/93 99/69  Pulse: (!) 107 (!) 124  Resp: 11 15  Temp: 36.7 C 36.9 C  SpO2: 98% 97%    Last Pain:  Vitals:   12/17/22 1322  TempSrc: Oral  PainSc: 0-No pain                 Safire Gordin C Yaw Escoto

## 2022-12-17 NOTE — Anesthesia Preprocedure Evaluation (Addendum)
Anesthesia Evaluation  Patient identified by MRN, date of birth, ID band Patient awake    Reviewed: Allergy & Precautions, H&P , NPO status , Patient's Chart, lab work & pertinent test results  Airway Mallampati: II  TM Distance: >3 FB Neck ROM: Full    Dental  (+) Dental Advisory Given, Missing   Pulmonary former smoker   Pulmonary exam normal breath sounds clear to auscultation       Cardiovascular Exercise Tolerance: Good hypertension, Pt. on medications Normal cardiovascular exam Rhythm:Regular Rate:Normal     Neuro/Psych  PSYCHIATRIC DISORDERS Anxiety     negative neurological ROS     GI/Hepatic negative GI ROS, Neg liver ROS,,,  Endo/Other  diabetes, Well Controlled, Type 2, Oral Hypoglycemic Agents    Renal/GU negative Renal ROS  negative genitourinary   Musculoskeletal negative musculoskeletal ROS (+)    Abdominal   Peds negative pediatric ROS (+)  Hematology negative hematology ROS (+)   Anesthesia Other Findings   Reproductive/Obstetrics negative OB ROS                             Anesthesia Physical Anesthesia Plan  ASA: 2  Anesthesia Plan: General   Post-op Pain Management: Minimal or no pain anticipated   Induction: Intravenous  PONV Risk Score and Plan: Propofol infusion  Airway Management Planned: Nasal Cannula and Natural Airway  Additional Equipment:   Intra-op Plan:   Post-operative Plan:   Informed Consent: I have reviewed the patients History and Physical, chart, labs and discussed the procedure including the risks, benefits and alternatives for the proposed anesthesia with the patient or authorized representative who has indicated his/her understanding and acceptance.     Dental advisory given  Plan Discussed with: CRNA and Surgeon  Anesthesia Plan Comments:        Anesthesia Quick Evaluation

## 2022-12-17 NOTE — Discharge Instructions (Addendum)
  Colonoscopy Discharge Instructions  Read the instructions outlined below and refer to this sheet in the next few weeks. These discharge instructions provide you with general information on caring for yourself after you leave the hospital. Your doctor may also give you specific instructions. While your treatment has been planned according to the most current medical practices available, unavoidable complications occasionally occur.   ACTIVITY You may resume your regular activity, but move at a slower pace for the next 24 hours.  Take frequent rest periods for the next 24 hours.  Walking will help get rid of the air and reduce the bloated feeling in your belly (abdomen).  No driving for 24 hours (because of the medicine (anesthesia) used during the test).   Do not sign any important legal documents or operate any machinery for 24 hours (because of the anesthesia used during the test).  NUTRITION Drink plenty of fluids.  You may resume your normal diet as instructed by your doctor.  Begin with a light meal and progress to your normal diet. Heavy or fried foods are harder to digest and may make you feel sick to your stomach (nauseated).  Avoid alcoholic beverages for 24 hours or as instructed.  MEDICATIONS You may resume your normal medications unless your doctor tells you otherwise.  WHAT YOU CAN EXPECT TODAY Some feelings of bloating in the abdomen.  Passage of more gas than usual.  Spotting of blood in your stool or on the toilet paper.  IF YOU HAD POLYPS REMOVED DURING THE COLONOSCOPY: No aspirin products for 7 days or as instructed.  No alcohol for 7 days or as instructed.  Eat a soft diet for the next 24 hours.  FINDING OUT THE RESULTS OF YOUR TEST Not all test results are available during your visit. If your test results are not back during the visit, make an appointment with your caregiver to find out the results. Do not assume everything is normal if you have not heard from your  caregiver or the medical facility. It is important for you to follow up on all of your test results.  SEEK IMMEDIATE MEDICAL ATTENTION IF: You have more than a spotting of blood in your stool.  Your belly is swollen (abdominal distention).  You are nauseated or vomiting.  You have a temperature over 101.  You have abdominal pain or discomfort that is severe or gets worse throughout the day.   Your colonoscopy revealed 1 polyp(s) which I removed successfully. Await pathology results, my office will contact you. I recommend repeating colonoscopy in 7-10 years for surveillance purposes depending on pathology results. Otherwise follow up with GI as needed.    I hope you have a great rest of your week!  Tracy Kelley. Abbey Chatters, D.O. Gastroenterology and Hepatology [facility]
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
## 2022-12-17 NOTE — Transfer of Care (Signed)
Immediate Anesthesia Transfer of Care Note  Patient: Tracy Kelley  Procedure(s) Performed: COLONOSCOPY WITH PROPOFOL POLYPECTOMY  Patient Location: Endoscopy Unit  Anesthesia Type:General  Level of Consciousness: awake, alert , and oriented  Airway & Oxygen Therapy: Patient Spontanous Breathing  Post-op Assessment: Report given to RN and Post -op Vital signs reviewed and stable  Post vital signs: Reviewed and stable  Last Vitals:  Vitals Value Taken Time  BP 99/69 12/17/22 1322  Temp 36.9 C 12/17/22 1322  Pulse 124 12/17/22 1322  Resp 15 12/17/22 1322  SpO2 97 % 12/17/22 1322    Last Pain:  Vitals:   12/17/22 1322  TempSrc: Oral  PainSc: 0-No pain      Patients Stated Pain Goal: 7 ([id number])  Complications: No notable events documented.

## 2022-12-17 NOTE — Op Note (Signed)
[facility] Patient Name: Tracy Kelley Procedure Date: 12/17/2022 12:47 PM MRN: [id number] Date of Birth: 06-08-1971 Attending MD: Elon Alas. Abbey Chatters , [place], [id number] CSN: [id number] Age: 52 Admit Type: Outpatient Procedure:                Colonoscopy Indications:              Screening for colorectal malignant neoplasm Providers:                Elon Alas. Abbey Chatters, DO, Janeece Riggers, RN, Kristine L.                            Risa Grill, Technician Referring MD:              Medicines:                See the Anesthesia note for documentation of the                            administered medications Complications:            No immediate complications. Estimated Blood Loss:     Estimated blood loss was minimal. Procedure:                Pre-Anesthesia Assessment:                           - The anesthesia plan was to use monitored                            anesthesia care (MAC).                           After obtaining informed consent, the colonoscope                            was passed under direct vision. Throughout the                            procedure, the patient's blood pressure, pulse, and                            oxygen saturations were monitored continuously. The                            [id number]) scope was introduced through                            the anus and advanced to the the cecum, identified                            by appendiceal orifice and ileocecal valve. The                            colonoscopy was performed without difficulty. The                            patient tolerated the  procedure well. The quality                            of the bowel preparation was evaluated using the                            BBPS [facility] Bowel Preparation Scale) with scores                            of: Right Colon = 3, Transverse Colon = 3 and Left                            Colon = 3 (entire mucosa seen well with no residual                             staining, small fragments of stool or opaque                            liquid). The total BBPS score equals 9. Scope In: 1:03:04 PM Scope Out: 1:18:18 PM Scope Withdrawal Time: 0 hours 11 minutes 45 seconds  Total Procedure Duration: 0 hours 15 minutes 14 seconds  Findings:      The perianal and digital rectal examinations were normal.      Non-bleeding internal hemorrhoids were found during endoscopy.      A 4 mm polyp was found in the cecum. The polyp was sessile. The polyp       was removed with a cold snare. Resection and retrieval were complete.      The exam was otherwise without abnormality. Impression:               - Non-bleeding internal hemorrhoids.                           - One 4 mm polyp in the cecum, removed with a cold                            snare. Resected and retrieved.                           - The examination was otherwise normal. Moderate Sedation:      Per Anesthesia Care Recommendation:           - Patient has a contact number available for                            emergencies. The signs and symptoms of potential                            delayed complications were discussed with the                            patient. Return to normal activities tomorrow.                            Written discharge instructions were provided  to the                            patient.                           - Resume previous diet.                           - Continue present medications.                           - Await pathology results.                           - Repeat colonoscopy in 7-10 years for surveillance.                           - Return to GI clinic PRN. Procedure Code(s):        --- Professional ---                           [phone], Colonoscopy, flexible; with removal of                            tumor(s), polyp(s), or other lesion(s) by snare                            technique Diagnosis Code(s):        --- Professional ---                            Z12.11, Encounter for screening for malignant                            neoplasm of colon                           D12.0, Benign neoplasm of cecum                           K64.8, Other hemorrhoids CPT copyright 2022 American Medical Association. All rights reserved. The codes documented in this report are preliminary and upon coder review may  be revised to meet current compliance requirements. Elon Alas. Abbey Chatters, DO [place] Abbey Chatters, DO 12/17/2022 1:20:30 PM This report has been signed electronically. Number of Addenda: 0

## 2022-12-20 LAB — SURGICAL PATHOLOGY

## 2022-12-23 ENCOUNTER — Encounter: Payer: Self-pay | Admitting: Radiology

## 2022-12-27 ENCOUNTER — Encounter (HOSPITAL_COMMUNITY): Payer: Self-pay | Admitting: Internal Medicine

## 2022-12-30 ENCOUNTER — Encounter: Payer: Self-pay | Admitting: Radiology

## 2023-01-11 ENCOUNTER — Ambulatory Visit (INDEPENDENT_AMBULATORY_CARE_PROVIDER_SITE_OTHER): Payer: PRIVATE HEALTH INSURANCE | Admitting: Family Medicine

## 2023-01-11 DIAGNOSIS — R202 Paresthesia of skin: Secondary | ICD-10-CM | POA: Insufficient documentation

## 2023-01-11 DIAGNOSIS — E119 Type 2 diabetes mellitus without complications: Secondary | ICD-10-CM | POA: Insufficient documentation

## 2023-01-11 DIAGNOSIS — I10 Essential (primary) hypertension: Secondary | ICD-10-CM

## 2023-01-11 DIAGNOSIS — N1831 Chronic kidney disease, stage 3a: Secondary | ICD-10-CM

## 2023-01-11 DIAGNOSIS — E1122 Type 2 diabetes mellitus with diabetic chronic kidney disease: Secondary | ICD-10-CM | POA: Diagnosis not present

## 2023-01-11 DIAGNOSIS — E785 Hyperlipidemia, unspecified: Secondary | ICD-10-CM

## 2023-01-11 MED ORDER — GLIPIZIDE 5 MG PO TABS: ORAL_TABLET | ORAL | 1 refills | Status: DC

## 2023-01-11 MED ORDER — LOSARTAN POTASSIUM 50 MG PO TABS: 50.0000 mg | ORAL_TABLET | Freq: Every day | ORAL | 1 refills | Status: DC

## 2023-01-11 MED ORDER — FENOFIBRATE 160 MG PO TABS: ORAL_TABLET | ORAL | 1 refills | Status: DC

## 2023-01-11 MED ORDER — HYDROCHLOROTHIAZIDE 25 MG PO TABS: ORAL_TABLET | ORAL | 1 refills | Status: DC

## 2023-01-11 MED ORDER — AMLODIPINE BESYLATE 10 MG PO TABS: ORAL_TABLET | ORAL | 1 refills | Status: DC

## 2023-01-11 NOTE — Assessment & Plan Note (Signed)
Stopping HCTZ due to hypercalcemia.  Proceeding with further workup of hypercalcemia as well.  Continue amlodipine.  Losartan and lieu of HCTZ.

## 2023-01-11 NOTE — Assessment & Plan Note (Signed)
Stable.  Continue glipizide.

## 2023-01-11 NOTE — Assessment & Plan Note (Signed)
Calcium, PTH, vitamin D for further evaluation.

## 2023-01-11 NOTE — Assessment & Plan Note (Signed)
Concern that this is coming from the cervical spine.  X-ray today for further evaluation.

## 2023-01-11 NOTE — Assessment & Plan Note (Signed)
Reassessing lipids today.  Continue Crestor and fenofibrate.

## 2023-01-11 NOTE — Progress Notes (Signed)
Subjective:  Patient ID: Tracy Kelley, female    DOB: November 07, 1970  Age: 52 y.o. MRN: [id number]  CC: Chief Complaint  Patient presents with   Hypertension   Diabetes   right arm pain     Tingling on and off waking up at night w/ it 2 to 3 weeks    HPI:  52 year old female with hypertension, type 2 diabetes, and hyperlipidemia presents for follow-up.  Patient's blood pressure now well-controlled.  She is currently on HCTZ and amlodipine.  Recently documented hypercalcemia needs additional workup.  Thiazide could be contributing.  Will discuss cessation of HCTZ today.  Patient's type 2 diabetes has been well-controlled.  Most recent A1c 6.7.  She is currently on glipizide 5 mg daily.  Patient compliant with fenofibrate and Crestor regarding hyperlipidemia.  Needs reevaluation of lipids today.  Additionally, patient reports that for the past 3 weeks she has been experiencing numbness/paresthesias of the right arm.  She states that it has been keeping her up at night.  Occurs quite often.  No relieving factors.  Patient Active Problem List   Diagnosis Date Noted   DM2 (diabetes mellitus, type 2) ([place]) 01/11/2023   Hypercalcemia 01/11/2023   Arm paresthesia, right 01/11/2023   Hyperlipidemia 03/02/2022   Status post total replacement of right hip 11/10/2021   Dense breasts 10/10/2021   Foraminal stenosis of lumbar region 12/02/2020   Essential hypertension 11/05/2016    Social Hx   Social History   Socioeconomic History   Marital status: Divorced    Spouse name: Not on file   Number of children: 4   Years of education: 13   Highest education level: Not on file  Occupational History   Occupation: cust service    Employer: [employer]  Tobacco Use   Smoking status: Former    Years: 10    Types: Cigarettes   Smokeless tobacco: Never  Vaping Use   Vaping Use: Never used  Substance and Sexual Activity   Alcohol use: Yes    Alcohol/week: 3.0 standard drinks of  alcohol    Types: 3 Glasses of wine per week    Comment: occasional wine   Drug use: No   Sexual activity: Yes    Birth control/protection: Post-menopausal    Comment: separated  Other Topics Concern   Not on file  Social History Narrative   Separated   Works for [employer]   4 boys   2 at home   Social Determinants of Health   Financial Resource Strain: Not on file  Food Insecurity: No Food Insecurity (11/18/2022)   Hunger Vital Sign    Worried About Running Out of Food in the Last Year: Never true    Ran Out of Food in the Last Year: Never true  Transportation Needs: No Transportation Needs (11/18/2022)   PRAPARE - Hydrologist (Medical): No    Lack of Transportation (Non-Medical): No  Physical Activity: Not on file  Stress: Not on file  Social Connections: Not on file    Review of Systems Per HPI  Objective:  BP 127/85   Pulse (!) 105   Temp 98.6 F (37 C)   Ht 5\' 9"  (1.753 m)   Wt 218 lb (98.9 kg)   LMP 03/26/2019   SpO2 97%   BMI 32.19 kg/m      01/11/2023    2:20 PM 12/17/2022    1:22 PM 12/17/2022   11:12 AM  BP/Weight  Systolic BP [id number]  Diastolic BP 85 69 93  Wt. (Lbs) 218  210  BMI 32.19 kg/m2  31.01 kg/m2    Physical Exam Vitals and nursing note reviewed.  Constitutional:      General: She is not in acute distress.    Appearance: Normal appearance.  Cardiovascular:     Rate and Rhythm: Normal rate and regular rhythm.  Pulmonary:     Effort: Pulmonary effort is normal.     Breath sounds: Normal breath sounds. No wheezing or rales.  Musculoskeletal:     Comments: No current paresthesias.  No weakness of the right upper extremity.  Neurological:     Mental Status: She is alert.  Psychiatric:        Mood and Affect: Mood normal.        Behavior: Behavior normal.     Lab Results  Component Value Date   WBC 8.8 10/14/2022   HGB 10.9 (L) 10/14/2022   HCT 34.8 10/14/2022   PLT 426 10/14/2022    GLUCOSE 95 12/15/2022   CHOL 197 10/14/2022   TRIG 107 10/14/2022   HDL 44 10/14/2022   LDLCALC 134 (H) 10/14/2022   ALT 11 10/14/2022   AST 16 10/14/2022   NA 139 12/15/2022   K 3.7 12/15/2022   CL 97 12/15/2022   CREATININE 1.21 (H) 12/15/2022   BUN 18 12/15/2022   CO2 23 12/15/2022   TSH 1.860 01/14/2021   HGBA1C 6.7 (H) 10/14/2022   MICROALBUR 1.0 05/11/2017     Assessment & Plan:   Problem List Items Addressed This Visit       Cardiovascular and Mediastinum   Essential hypertension    Stopping HCTZ due to hypercalcemia.  Proceeding with further workup of hypercalcemia as well.  Continue amlodipine.  Losartan and lieu of HCTZ.      Relevant Medications   amLODipine (NORVASC) 10 MG tablet   fenofibrate 160 MG tablet   losartan (COZAAR) 50 MG tablet   Other Relevant Orders   Vitamin D, 25-hydroxy     Endocrine   DM2 (diabetes mellitus, type 2) (HCC)    Stable.  Continue glipizide.      Relevant Medications   glipiZIDE (GLUCOTROL) 5 MG tablet   losartan (COZAAR) 50 MG tablet     Other   Arm paresthesia, right    Concern that this is coming from the cervical spine.  X-ray today for further evaluation.      Relevant Orders   DG Cervical Spine Complete   Hypercalcemia    Calcium, PTH, vitamin D for further evaluation.      Relevant Orders   PTH, Intact and Calcium   Vitamin D, 25-hydroxy   Hyperlipidemia    Reassessing lipids today.  Continue Crestor and fenofibrate.      Relevant Medications   amLODipine (NORVASC) 10 MG tablet   fenofibrate 160 MG tablet   losartan (COZAAR) 50 MG tablet   Other Relevant Orders   Lipid panel    Meds ordered this encounter  Medications   amLODipine (NORVASC) 10 MG tablet    Sig: TAKE 1 TABLET(10 MG) BY MOUTH DAILY    Dispense:  90 tablet    Refill:  1   fenofibrate 160 MG tablet    Sig: TAKE 1 TABLET(160 MG) BY MOUTH DAILY    Dispense:  90 tablet    Refill:  1   glipiZIDE (GLUCOTROL) 5 MG tablet    Sig:  TAKE 1  TABLET(5 MG) BY MOUTH DAILY    Dispense:  90 tablet    Refill:  1   DISCONTD: hydrochlorothiazide (HYDRODIURIL) 25 MG tablet    Sig: TAKE 1 TABLET(25 MG) BY MOUTH DAILY    Dispense:  90 tablet    Refill:  1   losartan (COZAAR) 50 MG tablet    Sig: Take 1 tablet (50 mg total) by mouth daily.    Dispense:  90 tablet    Refill:  1    Follow-up:  Return in about 1 month (around 02/11/2023).  [place]

## 2023-01-11 NOTE — Patient Instructions (Signed)
Labs today.  Xray when you can.  Stop HCTZ. Start Losartan.  Follow up in 1 month.

## 2023-01-12 ENCOUNTER — Ambulatory Visit (HOSPITAL_COMMUNITY)
Admission: RE | Admit: 2023-01-12 | Discharge: 2023-01-12 | Disposition: A | Discharge status: Home / Self Care | Payer: PRIVATE HEALTH INSURANCE | Source: Ambulatory Visit | Attending: Family Medicine | Admitting: Family Medicine

## 2023-01-12 DIAGNOSIS — R202 Paresthesia of skin: Secondary | ICD-10-CM | POA: Diagnosis present

## 2023-01-12 LAB — LIPID PANEL
Chol/HDL Ratio: 3 ratio (ref 0.0–4.4)
Cholesterol, Total: 124 mg/dL (ref 100–199)
HDL: 41 mg/dL (ref >39)
LDL Chol Calc (NIH): 63 mg/dL (ref 0–99)
Triglycerides: 108 mg/dL (ref 0–149)
VLDL Cholesterol Cal: 20 mg/dL (ref 5–40)

## 2023-01-12 LAB — PTH, INTACT AND CALCIUM
Calcium: 10.4 mg/dL — ABNORMAL HIGH (ref 8.7–10.2)
PTH: 36 pg/mL (ref 15–65)

## 2023-01-12 LAB — VITAMIN D 25 HYDROXY (VIT D DEFICIENCY, FRACTURES): Vit D, 25-Hydroxy: 22 ng/mL — ABNORMAL LOW (ref 30.0–100.0)

## 2023-01-14 ENCOUNTER — Inpatient Hospital Stay: Payer: PRIVATE HEALTH INSURANCE | Attending: Hematology

## 2023-01-14 DIAGNOSIS — D649 Anemia, unspecified: Secondary | ICD-10-CM

## 2023-01-14 DIAGNOSIS — E538 Deficiency of other specified B group vitamins: Secondary | ICD-10-CM | POA: Insufficient documentation

## 2023-01-14 DIAGNOSIS — D509 Iron deficiency anemia, unspecified: Secondary | ICD-10-CM | POA: Insufficient documentation

## 2023-01-14 LAB — IRON AND TIBC
Iron: 44 ug/dL (ref 28–170)
Saturation Ratios: 9 % — ABNORMAL LOW (ref 10.4–31.8)
TIBC: 495 ug/dL — ABNORMAL HIGH (ref 250–450)
UIBC: 451 ug/dL

## 2023-01-14 LAB — RETICULOCYTES
Immature Retic Fract: 10.6 % (ref 2.3–15.9)
RBC.: 4.35 MIL/uL (ref 3.87–5.11)
Retic Count, Absolute: 54.4 10*3/uL (ref 19.0–186.0)
Retic Ct Pct: 1.3 % (ref 0.4–3.1)

## 2023-01-14 LAB — LACTATE DEHYDROGENASE: LDH: 141 U/L (ref 98–192)

## 2023-01-14 LAB — COMPREHENSIVE METABOLIC PANEL
ALT: 13 U/L (ref 0–44)
AST: 19 U/L (ref 15–41)
Albumin: 3.7 g/dL (ref 3.5–5.0)
Alkaline Phosphatase: 72 U/L (ref 38–126)
Anion gap: 10 (ref 5–15)
BUN: 16 mg/dL (ref 6–20)
CO2: 28 mmol/L (ref 22–32)
Calcium: 9.8 mg/dL (ref 8.9–10.3)
Chloride: 98 mmol/L (ref 98–111)
Creatinine, Ser: 1.02 mg/dL — ABNORMAL HIGH (ref 0.44–1.00)
GFR, Estimated: >60 mL/min (ref >60)
Glucose, Bld: 82 mg/dL (ref 70–99)
Potassium: 3.2 mmol/L — ABNORMAL LOW (ref 3.5–5.1)
Sodium: 136 mmol/L (ref 135–145)
Total Bilirubin: 0.3 mg/dL (ref 0.3–1.2)
Total Protein: 7.6 g/dL (ref 6.5–8.1)

## 2023-01-14 LAB — CBC WITH DIFFERENTIAL/PLATELET
Abs Immature Granulocytes: 0.03 10*3/uL (ref 0.00–0.07)
Basophils Absolute: 0 10*3/uL (ref 0.0–0.1)
Basophils Relative: 0 %
Eosinophils Absolute: 0.1 10*3/uL (ref 0.0–0.5)
Eosinophils Relative: 2 %
HCT: 34.8 % — ABNORMAL LOW (ref 36.0–46.0)
Hemoglobin: 10.8 g/dL — ABNORMAL LOW (ref 12.0–15.0)
Immature Granulocytes: 0 %
Lymphocytes Relative: 29 %
Lymphs Abs: 2.2 10*3/uL (ref 0.7–4.0)
MCH: 25.2 pg — ABNORMAL LOW (ref 26.0–34.0)
MCHC: 31 g/dL (ref 30.0–36.0)
MCV: 81.1 fL (ref 80.0–100.0)
Monocytes Absolute: 0.5 10*3/uL (ref 0.1–1.0)
Monocytes Relative: 7 %
Neutro Abs: 4.6 10*3/uL (ref 1.7–7.7)
Neutrophils Relative %: 62 %
Platelets: 295 10*3/uL (ref 150–400)
RBC: 4.29 MIL/uL (ref 3.87–5.11)
RDW: 17.2 % — ABNORMAL HIGH (ref 11.5–15.5)
WBC: 7.5 10*3/uL (ref 4.0–10.5)
nRBC: 0 % (ref 0.0–0.2)

## 2023-01-14 LAB — FOLATE: Folate: 7.1 ng/mL (ref >5.9)

## 2023-01-14 LAB — VITAMIN B12: Vitamin B-12: 125 pg/mL — ABNORMAL LOW (ref 180–914)

## 2023-01-14 LAB — FERRITIN: Ferritin: 39 ng/mL (ref 11–307)

## 2023-01-17 LAB — KAPPA/LAMBDA LIGHT CHAINS
Kappa free light chain: 37.9 mg/L — ABNORMAL HIGH (ref 3.3–19.4)
Kappa, lambda light chain ratio: 1.67 — ABNORMAL HIGH (ref 0.26–1.65)
Lambda free light chains: 22.7 mg/L (ref 5.7–26.3)

## 2023-01-18 LAB — PROTEIN ELECTROPHORESIS, SERUM
A/G Ratio: 0.9 (ref 0.7–1.7)
Albumin ELP: 3.4 g/dL (ref 2.9–4.4)
Alpha-1-Globulin: 0.3 g/dL (ref 0.0–0.4)
Alpha-2-Globulin: 0.8 g/dL (ref 0.4–1.0)
Beta Globulin: 1.3 g/dL (ref 0.7–1.3)
Gamma Globulin: 1.5 g/dL (ref 0.4–1.8)
Globulin, Total: 4 g/dL — ABNORMAL HIGH (ref 2.2–3.9)
Total Protein ELP: 7.4 g/dL (ref 6.0–8.5)

## 2023-01-18 LAB — COPPER, SERUM: Copper: 104 ug/dL (ref 80–158)

## 2023-01-20 LAB — IMMUNOFIXATION ELECTROPHORESIS
IgA: 460 mg/dL — ABNORMAL HIGH (ref 87–352)
IgG (Immunoglobin G), Serum: 1413 mg/dL (ref 586–1602)
IgM (Immunoglobulin M), Srm: 77 mg/dL (ref 26–217)
Total Protein ELP: 7 g/dL (ref 6.0–8.5)

## 2023-01-21 ENCOUNTER — Encounter: Payer: Self-pay | Admitting: *Deleted

## 2023-01-24 ENCOUNTER — Encounter: Payer: Self-pay | Admitting: Physician Assistant

## 2023-01-24 ENCOUNTER — Inpatient Hospital Stay: Payer: PRIVATE HEALTH INSURANCE | Attending: Hematology | Admitting: Physician Assistant

## 2023-01-24 ENCOUNTER — Inpatient Hospital Stay: Payer: PRIVATE HEALTH INSURANCE

## 2023-01-24 VITALS — BP 139/89 | HR 98 | Temp 98.7°F | Resp 19 | Ht 69.0 in | Wt 213.0 lb

## 2023-01-24 DIAGNOSIS — F5089 Other specified eating disorder: Secondary | ICD-10-CM | POA: Diagnosis not present

## 2023-01-24 DIAGNOSIS — Z8042 Family history of malignant neoplasm of prostate: Secondary | ICD-10-CM | POA: Insufficient documentation

## 2023-01-24 DIAGNOSIS — Z8041 Family history of malignant neoplasm of ovary: Secondary | ICD-10-CM | POA: Diagnosis not present

## 2023-01-24 DIAGNOSIS — N1831 Chronic kidney disease, stage 3a: Secondary | ICD-10-CM | POA: Diagnosis not present

## 2023-01-24 DIAGNOSIS — D508 Other iron deficiency anemias: Secondary | ICD-10-CM | POA: Diagnosis not present

## 2023-01-24 DIAGNOSIS — E1122 Type 2 diabetes mellitus with diabetic chronic kidney disease: Secondary | ICD-10-CM | POA: Diagnosis not present

## 2023-01-24 DIAGNOSIS — E538 Deficiency of other specified B group vitamins: Secondary | ICD-10-CM

## 2023-01-24 DIAGNOSIS — D509 Iron deficiency anemia, unspecified: Secondary | ICD-10-CM | POA: Diagnosis present

## 2023-01-24 DIAGNOSIS — I129 Hypertensive chronic kidney disease with stage 1 through stage 4 chronic kidney disease, or unspecified chronic kidney disease: Secondary | ICD-10-CM | POA: Diagnosis not present

## 2023-01-24 DIAGNOSIS — Z87891 Personal history of nicotine dependence: Secondary | ICD-10-CM | POA: Diagnosis not present

## 2023-01-24 DIAGNOSIS — R5383 Other fatigue: Secondary | ICD-10-CM | POA: Diagnosis not present

## 2023-01-24 DIAGNOSIS — Z803 Family history of malignant neoplasm of breast: Secondary | ICD-10-CM | POA: Diagnosis not present

## 2023-01-24 HISTORY — DX: Deficiency of other specified B group vitamins: E53.8

## 2023-01-24 LAB — METHYLMALONIC ACID, SERUM: Methylmalonic Acid, Quantitative: 548 nmol/L — ABNORMAL HIGH (ref 0–378)

## 2023-01-24 NOTE — Patient Instructions (Addendum)
[place] at [facility] **   You were seen today by Tarri Abernethy PA-C for your anemia.    ANEMIA: Your blood levels are slightly improved, but not yet back to normal. Your iron levels have not improved as much as would be expected from taking the iron pill.  Since you would like to hold off on any IV iron for the time being, please continue to take iron tablets once a day. You also have low vitamin B12.  Since you do not want to try injections at this time, I recommend that you start taking over-the-counter vitamin B12 1000 mcg daily.    FOLLOW-UP APPOINTMENT: We will recheck labs and see you for follow-up (phone) visit in 3 months.  ** Thank you for trusting me with your healthcare!  I strive to provide all of my patients with quality care at each visit.  If you receive a survey for this visit, I would be so grateful to you for taking the time to provide feedback.  Thank you in advance!  ~ Jazara Swiney                   Dr. Derek Jack   &   Tarri Abernethy, PA-C   - - - - - - - - - - - - - - - - - -    Thank you for choosing [place] at [facility] to provide your oncology and hematology care.  To afford each patient quality time with our provider, please arrive at least 15 minutes before your scheduled appointment time.   If you have a lab appointment with the [facility] please come in thru the Main Entrance and check in at the main information desk.  You need to re-schedule your appointment should you arrive 10 or more minutes late.  We strive to give you quality time with our providers, and arriving late affects you and other patients whose appointments are after yours.  Also, if you no show three or more times for appointments you may be dismissed from the clinic at the providers discretion.     Again, thank you for choosing [facility].  Our hope is that these requests  will decrease the amount of time that you wait before being seen by our physicians.       _____________________________________________________________  Should you have questions after your visit to [facility], please contact our office at [phone] and follow the prompts.  Our office hours are 8:00 a.m. and 4:30 p.m. Monday - Friday.  Please note that voicemails left after 4:00 p.m. may not be returned until the following business day.  We are closed weekends and major holidays.  You do have access to a nurse 24-7, just call the main number to the clinic [phone] and do not press any options, hold on the line and a nurse will answer the phone.    For prescription refill requests, have your pharmacy contact our office and allow 72 hours.
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
## 2023-02-08 ENCOUNTER — Other Ambulatory Visit: Payer: Self-pay | Admitting: Orthopaedic Surgery

## 2023-02-15 ENCOUNTER — Ambulatory Visit (INDEPENDENT_AMBULATORY_CARE_PROVIDER_SITE_OTHER): Payer: PRIVATE HEALTH INSURANCE | Admitting: Family Medicine

## 2023-02-15 VITALS — BP 136/78 | HR 97 | Temp 98.1°F | Ht 69.0 in | Wt 219.0 lb

## 2023-02-15 DIAGNOSIS — I10 Essential (primary) hypertension: Secondary | ICD-10-CM

## 2023-02-15 NOTE — Patient Instructions (Signed)
Lab ordered. Do it at your convenience.  Continue your medications.  Follow up in 6 months.

## 2023-02-16 NOTE — Assessment & Plan Note (Signed)
Stable.  Continue amlodipine and losartan.  Metabolic panel today.

## 2023-02-16 NOTE — Progress Notes (Signed)
Subjective:  Patient ID: Tracy Kelley, female    DOB: [birth year]  Age: 52 y.o. MRN: [id number]  CC: Chief Complaint  Patient presents with   Hypertension    HPI:  52 year old female presents for follow-up regarding hypertension.  Patient's blood pressure is fairly well-controlled.  She is now taking losartan and amlodipine.  HCTZ has been discontinued.  Needs metabolic panel given recent hypokalemia as well as addition of losartan.  Patient is overall feeling well.  She has no complaints or concerns at this time.  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   Dense breasts 10/10/2021   Foraminal stenosis of lumbar region 12/02/2020   Essential hypertension 11/05/2016    Social Hx   Social History   Socioeconomic History   Marital status: Divorced    Spouse name: Not on file   Number of children: 4   Years of education: 13   Highest education level: Not on file  Occupational History   Occupation: cust service    Employer: [employer]  Tobacco Use   Smoking status: Former    Years: 10    Types: Cigarettes   Smokeless tobacco: Never  Vaping Use   Vaping Use: Never used  Substance and Sexual Activity   Alcohol use: Yes    Alcohol/week: 3.0 standard drinks of alcohol    Types: 3 Glasses of wine per week    Comment: occasional wine   Drug use: No   Sexual activity: Yes    Birth control/protection: Post-menopausal    Comment: separated  Other Topics Concern   Not on file  Social History Narrative   Separated   Works for city of [place]   4 boys   2 at home   Social Determinants of Health   Financial Resource Strain: Not on file  Food Insecurity: No Food Insecurity (11/18/2022)   Hunger Vital Sign    Worried About Running  Out of Food in the Last Year: Never true    Ran Out of Food in the Last Year: Never true  Transportation Needs: No Transportation Needs (11/18/2022)   PRAPARE - Administrator, Civil Service (Medical): No    Lack of Transportation (Non-Medical): No  Physical Activity: Not on file  Stress: Not on file  Social Connections: Not on file    Review of Systems Per HPI  Objective:  BP 136/78   Pulse 97   Temp 98.1 F (36.7 C)   Ht  (1.753 m)   Wt 219 lb (99.3 kg)   LMP 03/26/2019   SpO2 98%   BMI 32.34 kg/m      02/15/2023    2:09 PM 01/24/2023    2:09 PM 01/11/2023    2:20 PM  BP/Weight  Systolic BP 136 139 127  Diastolic BP 78 89 85  Wt. (Lbs) 219 213 218  BMI 32.34 kg/m2 31.45 kg/m2 32.19 kg/m2    Physical Exam Vitals and nursing note reviewed.  Constitutional:      General: She is not in acute distress.    Appearance: Normal appearance. She is obese.  HENT:     Head: Normocephalic and atraumatic.  Eyes:     General:        Right eye: No  discharge.        Left eye: No discharge.     Conjunctiva/sclera: Conjunctivae normal.  Cardiovascular:     Rate and Rhythm: Normal rate and regular rhythm.  Pulmonary:     Effort: Pulmonary effort is normal.     Breath sounds: Normal breath sounds.  Neurological:     Mental Status: She is alert.  Psychiatric:        Mood and Affect: Mood normal.        Behavior: Behavior normal.     Lab Results  Component Value Date   WBC 7.5 01/14/2023   HGB 10.8 (L) 01/14/2023   HCT 34.8 (L) 01/14/2023   PLT 295 01/14/2023   GLUCOSE 82 01/14/2023   CHOL 124 01/11/2023   TRIG 108 01/11/2023   HDL 41 01/11/2023   LDLCALC 63 01/11/2023   ALT 13 01/14/2023   AST 19 01/14/2023   NA 136 01/14/2023   K 3.2 (L) 01/14/2023   CL 98 01/14/2023   CREATININE 1.02 (H) 01/14/2023   BUN 16 01/14/2023   CO2 28 01/14/2023   TSH 1.860 01/14/2021   HGBA1C 6.7 (H) 10/14/2022   MICROALBUR 1.0 05/11/2017     Assessment & Plan:    Problem List Items Addressed This Visit       Cardiovascular and Mediastinum   Essential hypertension - Primary    Stable.  Continue amlodipine and losartan.  Metabolic panel today.      Relevant Orders   Basic metabolic panel    Follow-up:  6 months  Tashi Band Adriana Simas DO [facility]

## 2023-02-17 ENCOUNTER — Other Ambulatory Visit: Payer: Self-pay | Admitting: Orthopaedic Surgery

## 2023-02-26 LAB — BASIC METABOLIC PANEL
BUN/Creatinine Ratio: 13 (ref 9–23)
BUN: 14 mg/dL (ref 6–24)
CO2: 23 mmol/L (ref 20–29)
Calcium: 11 mg/dL — ABNORMAL HIGH (ref 8.7–10.2)
Chloride: 100 mmol/L (ref 96–106)
Creatinine, Ser: 1.05 mg/dL — ABNORMAL HIGH (ref 0.57–1.00)
Glucose: 104 mg/dL — ABNORMAL HIGH (ref 70–99)
Potassium: 4.4 mmol/L (ref 3.5–5.2)
Sodium: 138 mmol/L (ref 134–144)
eGFR: 65 mL/min/{1.73_m2} (ref >59)

## 2023-03-31 ENCOUNTER — Other Ambulatory Visit: Payer: Self-pay | Admitting: Orthopaedic Surgery

## 2023-04-25 ENCOUNTER — Other Ambulatory Visit: Payer: Self-pay

## 2023-04-25 DIAGNOSIS — D649 Anemia, unspecified: Secondary | ICD-10-CM

## 2023-04-25 DIAGNOSIS — E538 Deficiency of other specified B group vitamins: Secondary | ICD-10-CM

## 2023-04-29 ENCOUNTER — Inpatient Hospital Stay: Payer: 59

## 2023-05-06 ENCOUNTER — Inpatient Hospital Stay: Payer: 59 | Admitting: Physician Assistant

## 2023-05-09 ENCOUNTER — Other Ambulatory Visit: Payer: Self-pay | Admitting: Orthopaedic Surgery

## 2023-05-10 ENCOUNTER — Other Ambulatory Visit: Payer: Self-pay | Admitting: Orthopaedic Surgery

## 2023-05-11 ENCOUNTER — Other Ambulatory Visit: Payer: Self-pay | Admitting: Orthopaedic Surgery

## 2023-05-12 ENCOUNTER — Encounter: Payer: Self-pay | Admitting: Hematology

## 2023-05-12 ENCOUNTER — Inpatient Hospital Stay: Payer: 59 | Attending: Hematology

## 2023-05-12 DIAGNOSIS — Z8041 Family history of malignant neoplasm of ovary: Secondary | ICD-10-CM | POA: Insufficient documentation

## 2023-05-12 DIAGNOSIS — D509 Iron deficiency anemia, unspecified: Secondary | ICD-10-CM | POA: Insufficient documentation

## 2023-05-12 DIAGNOSIS — Z803 Family history of malignant neoplasm of breast: Secondary | ICD-10-CM | POA: Diagnosis not present

## 2023-05-12 DIAGNOSIS — E538 Deficiency of other specified B group vitamins: Secondary | ICD-10-CM | POA: Insufficient documentation

## 2023-05-12 DIAGNOSIS — D649 Anemia, unspecified: Secondary | ICD-10-CM

## 2023-05-12 DIAGNOSIS — N1831 Chronic kidney disease, stage 3a: Secondary | ICD-10-CM | POA: Insufficient documentation

## 2023-05-12 DIAGNOSIS — Z8042 Family history of malignant neoplasm of prostate: Secondary | ICD-10-CM | POA: Diagnosis not present

## 2023-05-12 LAB — CBC WITH DIFFERENTIAL/PLATELET
Abs Immature Granulocytes: 0.03 10*3/uL (ref 0.00–0.07)
Basophils Absolute: 0 10*3/uL (ref 0.0–0.1)
Basophils Relative: 1 %
Eosinophils Absolute: 0.3 10*3/uL (ref 0.0–0.5)
Eosinophils Relative: 3 %
HCT: 39.7 % (ref 36.0–46.0)
Hemoglobin: 12.3 g/dL (ref 12.0–15.0)
Immature Granulocytes: 0 %
Lymphocytes Relative: 34 %
Lymphs Abs: 2.6 10*3/uL (ref 0.7–4.0)
MCH: 26 pg (ref 26.0–34.0)
MCHC: 31 g/dL (ref 30.0–36.0)
MCV: 83.9 fL (ref 80.0–100.0)
Monocytes Absolute: 0.6 10*3/uL (ref 0.1–1.0)
Monocytes Relative: 7 %
Neutro Abs: 4.2 10*3/uL (ref 1.7–7.7)
Neutrophils Relative %: 55 %
Platelets: 380 10*3/uL (ref 150–400)
RBC: 4.73 MIL/uL (ref 3.87–5.11)
RDW: 15.4 % (ref 11.5–15.5)
WBC: 7.7 10*3/uL (ref 4.0–10.5)
nRBC: 0 % (ref 0.0–0.2)

## 2023-05-12 LAB — COMPREHENSIVE METABOLIC PANEL
ALT: 15 U/L (ref 0–44)
AST: 19 U/L (ref 15–41)
Albumin: 4 g/dL (ref 3.5–5.0)
Alkaline Phosphatase: 80 U/L (ref 38–126)
Anion gap: 6 (ref 5–15)
BUN: 17 mg/dL (ref 6–20)
CO2: 29 mmol/L (ref 22–32)
Calcium: 10.5 mg/dL — ABNORMAL HIGH (ref 8.9–10.3)
Chloride: 103 mmol/L (ref 98–111)
Creatinine, Ser: 1.11 mg/dL — ABNORMAL HIGH (ref 0.44–1.00)
GFR, Estimated: >60 mL/min (ref >60)
Glucose, Bld: 128 mg/dL — ABNORMAL HIGH (ref 70–99)
Potassium: 3.9 mmol/L (ref 3.5–5.1)
Sodium: 138 mmol/L (ref 135–145)
Total Bilirubin: 0.2 mg/dL — ABNORMAL LOW (ref 0.3–1.2)
Total Protein: 8.1 g/dL (ref 6.5–8.1)

## 2023-05-12 LAB — IRON AND TIBC
Iron: 49 ug/dL (ref 28–170)
Saturation Ratios: 9 % — ABNORMAL LOW (ref 10.4–31.8)
TIBC: 564 ug/dL — ABNORMAL HIGH (ref 250–450)
UIBC: 515 ug/dL

## 2023-05-12 LAB — FOLATE: Folate: 6.1 ng/mL (ref >5.9)

## 2023-05-12 LAB — VITAMIN B12: Vitamin B-12: 3611 pg/mL — ABNORMAL HIGH (ref 180–914)

## 2023-05-12 LAB — FERRITIN: Ferritin: 66 ng/mL (ref 11–307)

## 2023-05-16 LAB — METHYLMALONIC ACID, SERUM: Methylmalonic Acid, Quantitative: 273 nmol/L (ref 0–378)

## 2023-05-18 NOTE — Progress Notes (Unsigned)
VIRTUAL VISIT via TELEPHONE NOTE [facility]   I connected with Tracy Kelley  on 05/19/23 at  8:19 AM by telephone and verified that I am speaking with the correct person using two identifiers.  Location: Patient: Home Provider: [facility]   I discussed the limitations, risks, security and privacy concerns of performing an evaluation and management service by telephone and the availability of in person appointments. I also discussed with the patient that there may be a patient responsible charge related to this service. The patient expressed understanding and agreed to proceed.  REASON FOR VISIT:  Follow-up for iron deficiency anemia   PRIOR THERAPY: None   CURRENT THERAPY: Ferrous sulfate 325 mg daily.  INTERVAL HISTORY:  Ms. Tracy Kelley is contacted today for follow-up of iron deficiency anemia.  She was last seen by Rojelio Brenner PA-C on 01/24/2023.  At today's visit, she reports feeling well.  She has been taking vitamin B12 5000 mcg daily as well as daily iron supplement since her last visit in January 2024, which she is tolerating well.  She reports improved energy, resolved paresthesias, and resolved ice pica.  She does have some right shoulder pain likely secondary to repetitive use at work.  She continues to deny any rectal bleeding, melena, epistaxis, or menstrual bleeding.  She has 100% energy and 100% appetite. She endorses that she is maintaining a stable weight.  REVIEW OF SYSTEMS:   Review of Systems  Constitutional:  Negative for chills, diaphoresis, fever, malaise/fatigue and weight loss.  Respiratory:  Negative for cough and shortness of breath.   Cardiovascular:  Negative for chest pain and palpitations.  Gastrointestinal:  Negative for abdominal pain, blood in stool, melena, nausea and vomiting.  Musculoskeletal:  Positive for joint pain (right shuoulder).  Neurological:  Negative for dizziness and headaches.     PHYSICAL EXAM:  (per limitations of virtual telephone visit)  The patient is alert and oriented x 3, exhibiting adequate mentation, good mood, and ability to speak in full sentences and execute sound judgement.  ASSESSMENT & PLAN:  Normocytic anemia (iron deficiency + B12 deficiency) - Seen at the request of her PCP (Dr. Everlene Other) for evaluation of microcytic anemia - Onset of anemia in January 2023 was around the same time as her hip replacement surgery. - CBC (10/14/2022) showed Hgb 10.9/MCV 77 with mildly elevated platelets 426. - Iron panel (10/20/2022) shows severe iron deficiency with ferritin 33, iron sat 9%, and elevated TIBC 601.  - No history of blood transfusion or donation.  Does not take any PPI, blood thinner, or NSAID medications. - Colonoscopy (12/17/2022): Nonbleeding internal hemorrhoids, polyp x 1 (tubular adenoma) - Additional hematology workup (01/14/2023):  B12 deficiency (B12 125, MMA 548).  Normal copper, folate, LDH. 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  SPEP negative for M spike.  Mildly elevated kappa light chain 37.9, normal lambda, minimally elevated free light chain ratio 1.67. - Daily iron supplementation started in January 2024.  Vitamin B12 supplement (5,000 mcg) since April 2024. - Postmenopausal (LMP 03/26/2019). No rectal bleeding, melena, epistaxis. - Fatigue, pica, and paresthesias resolved after B12 and iron replacement - Most recent labs (05/12/2023): Hgb 12.3/MCV 83.9 Creatinine 1.11/GFR >60 Ferritin 66, Iron saturation 9%, elevated TIBC 564 Vitamin B12 elevated at 3611, normal MMA.  Normal folate. - DIFFERENTIAL DIAGNOSIS favors multifactorial anemia from iron deficiency, B12 deficiency, and CKD - PLAN: Patient  would like to avoid IV iron or B12 injections. - Continue iron tablet  in the mornings w/ orange juice - Continue B12 supplement - recommend holding x 2  weeks, then decreasing to 500 mcg daily - Labs in 6 months = CBC/D, ferritin, iron/TIBC, B12, MMA - PHONE  visit in 6 months (1 week after labs) - Repeat MGUS/myeloma panel in 1 year (March 2025) due to elevated free light chain ratio.  2.  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 She drinks occasional social alcohol a few times each year, approximately 3-4 drinks in each sitting. She denies any tobacco or illicit drug use.  - FAMILY: No family history of blood disorders.  Her father had prostate cancer.  Maternal grandmother had ovarian cancer.  Maternal great-great-aunts x 2 had breast cancer.  PLAN SUMMARY: >> Labs in 6 months = CBC/D, ferritin, iron/TIBC, B12, MMA >> PHONE visit in 6 months (1 week after labs)  ** Last office visit on 01/24/2023    I discussed the assessment and treatment plan with the patient. The patient was provided an opportunity to ask questions and all were answered. The patient agreed with the plan and demonstrated an understanding of the instructions.   The patient was advised to call back or seek an in-person evaluation if the symptoms worsen or if the condition fails to improve as anticipated.  I provided 22 minutes of non-face-to-face time during this encounter.  Carnella Guadalajara, PA-C 05/19/23 8:41 AM

## 2023-05-19 ENCOUNTER — Encounter: Payer: Self-pay | Admitting: Physician Assistant

## 2023-05-19 ENCOUNTER — Inpatient Hospital Stay (HOSPITAL_BASED_OUTPATIENT_CLINIC_OR_DEPARTMENT_OTHER): Payer: 59 | Admitting: Physician Assistant

## 2023-05-19 DIAGNOSIS — Z803 Family history of malignant neoplasm of breast: Secondary | ICD-10-CM

## 2023-05-19 DIAGNOSIS — N1831 Chronic kidney disease, stage 3a: Secondary | ICD-10-CM | POA: Diagnosis not present

## 2023-05-19 DIAGNOSIS — D513 Other dietary vitamin B12 deficiency anemia: Secondary | ICD-10-CM | POA: Diagnosis not present

## 2023-05-19 DIAGNOSIS — D508 Other iron deficiency anemias: Secondary | ICD-10-CM | POA: Diagnosis not present

## 2023-05-19 DIAGNOSIS — E538 Deficiency of other specified B group vitamins: Secondary | ICD-10-CM

## 2023-05-19 DIAGNOSIS — Z8041 Family history of malignant neoplasm of ovary: Secondary | ICD-10-CM

## 2023-05-19 DIAGNOSIS — Z8042 Family history of malignant neoplasm of prostate: Secondary | ICD-10-CM | POA: Diagnosis not present

## 2023-06-20 ENCOUNTER — Encounter: Payer: Self-pay | Admitting: Hematology

## 2023-06-29 ENCOUNTER — Other Ambulatory Visit (HOSPITAL_COMMUNITY): Payer: Self-pay | Admitting: Family Medicine

## 2023-06-29 DIAGNOSIS — Z1231 Encounter for screening mammogram for malignant neoplasm of breast: Secondary | ICD-10-CM

## 2023-07-01 ENCOUNTER — Other Ambulatory Visit: Payer: Self-pay | Admitting: Orthopaedic Surgery

## 2023-07-01 ENCOUNTER — Encounter: Payer: Self-pay | Admitting: Hematology

## 2023-07-01 ENCOUNTER — Encounter: Payer: Self-pay | Admitting: Nurse Practitioner

## 2023-07-01 ENCOUNTER — Ambulatory Visit (INDEPENDENT_AMBULATORY_CARE_PROVIDER_SITE_OTHER): Payer: No Typology Code available for payment source | Admitting: Nurse Practitioner

## 2023-07-01 VITALS — BP 116/50 | HR 90 | Temp 97.8°F | Ht 69.0 in | Wt 228.0 lb

## 2023-07-01 DIAGNOSIS — Z1151 Encounter for screening for human papillomavirus (HPV): Secondary | ICD-10-CM

## 2023-07-01 DIAGNOSIS — Z01419 Encounter for gynecological examination (general) (routine) without abnormal findings: Secondary | ICD-10-CM

## 2023-07-01 DIAGNOSIS — Z23 Encounter for immunization: Secondary | ICD-10-CM

## 2023-07-01 DIAGNOSIS — Z01411 Encounter for gynecological examination (general) (routine) with abnormal findings: Secondary | ICD-10-CM | POA: Diagnosis not present

## 2023-07-01 DIAGNOSIS — G479 Sleep disorder, unspecified: Secondary | ICD-10-CM | POA: Diagnosis not present

## 2023-07-01 DIAGNOSIS — Z124 Encounter for screening for malignant neoplasm of cervix: Secondary | ICD-10-CM

## 2023-07-01 MED ORDER — TEMAZEPAM 15 MG PO CAPS
15.0000 mg | ORAL_CAPSULE | Freq: Every evening | ORAL | 0 refills | Status: DC | PRN
Start: 2023-07-01 — End: 2024-02-21
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
## 2023-07-04 ENCOUNTER — Encounter: Payer: Self-pay | Admitting: Nurse Practitioner

## 2023-07-04 DIAGNOSIS — G479 Sleep disorder, unspecified: Secondary | ICD-10-CM | POA: Insufficient documentation

## 2023-07-04 NOTE — Progress Notes (Signed)
   Subjective:    Patient ID: Tracy Kelley, female    DOB: Aug 22, 1971, 52 y.o.   MRN: [id number]  HPI    Review of Systems     Objective:   Physical Exam        Assessment & Plan:

## 2023-07-07 LAB — IGP, APTIMA HPV: HPV Aptima: NEGATIVE

## 2023-07-12 ENCOUNTER — Other Ambulatory Visit: Payer: Self-pay | Admitting: Family Medicine

## 2023-07-27 ENCOUNTER — Other Ambulatory Visit: Payer: Self-pay | Admitting: Family Medicine

## 2023-07-27 DIAGNOSIS — I10 Essential (primary) hypertension: Secondary | ICD-10-CM

## 2023-07-27 DIAGNOSIS — E1122 Type 2 diabetes mellitus with diabetic chronic kidney disease: Secondary | ICD-10-CM

## 2023-07-27 DIAGNOSIS — E785 Hyperlipidemia, unspecified: Secondary | ICD-10-CM

## 2023-08-11 ENCOUNTER — Encounter: Payer: Self-pay | Admitting: Hematology

## 2023-08-18 ENCOUNTER — Ambulatory Visit (HOSPITAL_COMMUNITY)
Admission: RE | Admit: 2023-08-18 | Discharge: 2023-08-18 | Disposition: A | Discharge status: Home / Self Care | Payer: 59 | Source: Ambulatory Visit | Attending: Family Medicine | Admitting: Family Medicine

## 2023-08-18 ENCOUNTER — Ambulatory Visit: Payer: PRIVATE HEALTH INSURANCE | Admitting: Family Medicine

## 2023-08-18 ENCOUNTER — Encounter (HOSPITAL_COMMUNITY): Payer: Self-pay

## 2023-08-18 DIAGNOSIS — Z1231 Encounter for screening mammogram for malignant neoplasm of breast: Secondary | ICD-10-CM

## 2023-08-23 ENCOUNTER — Ambulatory Visit (INDEPENDENT_AMBULATORY_CARE_PROVIDER_SITE_OTHER): Payer: 59 | Admitting: Family Medicine

## 2023-08-23 VITALS — BP 130/80 | HR 111 | Wt 231.2 lb

## 2023-08-23 DIAGNOSIS — I10 Essential (primary) hypertension: Secondary | ICD-10-CM | POA: Diagnosis not present

## 2023-08-23 DIAGNOSIS — N1831 Chronic kidney disease, stage 3a: Secondary | ICD-10-CM | POA: Diagnosis not present

## 2023-08-23 DIAGNOSIS — E785 Hyperlipidemia, unspecified: Secondary | ICD-10-CM | POA: Diagnosis not present

## 2023-08-23 DIAGNOSIS — E1122 Type 2 diabetes mellitus with diabetic chronic kidney disease: Secondary | ICD-10-CM

## 2023-08-23 DIAGNOSIS — Z7984 Long term (current) use of oral hypoglycemic drugs: Secondary | ICD-10-CM

## 2023-08-23 MED ORDER — ROSUVASTATIN CALCIUM 10 MG PO TABS: 10.0000 mg | ORAL_TABLET | Freq: Every day | ORAL | 3 refills | Status: DC

## 2023-08-23 NOTE — Assessment & Plan Note (Signed)
@   goal on Crestor. Continue.

## 2023-08-23 NOTE — Patient Instructions (Signed)
A1C ordered.  Continue your medications.  Follow up in 6 months.

## 2023-08-23 NOTE — Assessment & Plan Note (Signed)
A1C today. Continue Glipizide.

## 2023-08-23 NOTE — Progress Notes (Signed)
Subjective:  Patient ID: Tracy Kelley, female    DOB: Jun 09, 1971  Age: 52 y.o. MRN: [id number]  CC:  Follow up   HPI:  52 year old female with the below mentioned medical problems presents for follow up.  HTN Stable on Norvasc and Losartan.  DM-2 Has been stable.  Needs A1c.  Currently on Glipizide. Denies hypoglycemia.   HLD Well controlled on Crestor.   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/10/2021   Foraminal stenosis of lumbar region 12/02/2020   Essential hypertension 11/05/2016    Social Hx   Social History   Socioeconomic History   Marital status: Divorced    Spouse name: Not on file   Number of children: 4   Years of education: 13   Highest education level: Not on file  Occupational History   Occupation: cust service    Employer: [employer]  Tobacco Use   Smoking status: Former    Types: Cigarettes   Smokeless tobacco: Never  Vaping Use   Vaping status: Never Used  Substance and Sexual Activity   Alcohol use: Yes    Alcohol/week: 3.0 standard drinks of alcohol    Types: 3 Glasses of wine per week    Comment: occasional wine   Drug use: No   Sexual activity: Yes    Birth control/protection: Post-menopausal    Comment: separated  Other Topics Concern   Not on file  Social History Narrative   Separated   Works for [employer]   4 boys   2 at home   Social Determinants of Health   Financial Resource Strain: Not on file  Food Insecurity: No Food Insecurity (11/18/2022)   Hunger Vital Sign    Worried About Running Out of Food in the Last Year: Never true    Ran Out of Food in the Last Year: Never true  Transportation Needs: No Transportation Needs (11/18/2022)   PRAPARE - Scientist, research (physical sciences) (Medical): No    Lack of Transportation (Non-Medical): No  Physical Activity: Not on file  Stress: Not on file  Social Connections: Not on file    Review of Systems  Constitutional: Negative.   Respiratory: Negative.    Cardiovascular: Negative.    Objective:  BP 130/80   Pulse (!) 111   Wt 231 lb 3.2 oz (104.9 kg)   LMP 03/26/2019   SpO2 97%   BMI 34.14 kg/m      08/23/2023    2:37 PM 07/01/2023   10:45 AM 02/15/2023    2:09 PM  BP/Weight  Systolic BP 130 116 136  Diastolic BP 80 50 78  Wt. (Lbs) 231.2 228 219  BMI 34.14 kg/m2 33.67 kg/m2 32.34 kg/m2    Physical Exam Vitals and nursing note reviewed.  Constitutional:      General: She is not in acute distress.    Appearance: Normal appearance.  HENT:     Head: Normocephalic and atraumatic.  Eyes:     General:        Right eye: No discharge.        Left eye: No discharge.     Conjunctiva/sclera: Conjunctivae normal.  Cardiovascular:     Rate and Rhythm: Normal rate and regular rhythm.  Pulmonary:     Effort: Pulmonary effort is normal.     Breath sounds: Normal breath sounds. No wheezing, rhonchi or rales.  Neurological:     Mental Status: She is alert.  Psychiatric:        Mood and Affect: Mood normal.        Behavior: Behavior normal.    Lab Results  Component Value Date   WBC 7.7 05/12/2023   HGB 12.3 05/12/2023   HCT 39.7 05/12/2023   PLT 380 05/12/2023   GLUCOSE 128 (H) 05/12/2023   CHOL 124 01/11/2023   TRIG 108 01/11/2023   HDL 41 01/11/2023   LDLCALC 63 01/11/2023   ALT 15 05/12/2023   AST 19 05/12/2023   NA 138 05/12/2023   K 3.9 05/12/2023   CL 103 05/12/2023   CREATININE 1.11 (H) 05/12/2023   BUN 17 05/12/2023   CO2 29 05/12/2023   TSH 1.860 01/14/2021   HGBA1C 6.7 (H) 10/14/2022   MICROALBUR 1.0 05/11/2017     Assessment & Plan:   Problem List Items Addressed This Visit       Cardiovascular and Mediastinum   Essential hypertension    Stable. Continue  current meds.      Relevant Medications   rosuvastatin (CRESTOR) 10 MG tablet     Endocrine   DM2 (diabetes mellitus, type 2) (HCC) - Primary    A1C today. Continue Glipizide.       Relevant Medications   rosuvastatin (CRESTOR) 10 MG tablet   Other Relevant Orders   Hemoglobin A1c     Other   Hyperlipidemia    @ goal on Crestor. Continue.      Relevant Medications   rosuvastatin (CRESTOR) 10 MG tablet    Meds ordered this encounter  Medications   rosuvastatin (CRESTOR) 10 MG tablet    Sig: Take 1 tablet (10 mg total) by mouth daily.    Dispense:  90 tablet    Refill:  3    Follow-up:  Return in about 6 months (around 02/21/2024) for Diabetes follow up.  Everlene Other DO [facility]

## 2023-08-23 NOTE — Assessment & Plan Note (Signed)
Stable. Continue current meds.   

## 2023-09-06 ENCOUNTER — Encounter: Payer: Self-pay | Admitting: *Deleted

## 2023-09-06 LAB — HEMOGLOBIN A1C
Est. average glucose Bld gHb Est-mCnc: 194 mg/dL
Hgb A1c MFr Bld: 8.4 % — ABNORMAL HIGH (ref 4.8–5.6)

## 2023-09-07 ENCOUNTER — Telehealth: Payer: Self-pay | Admitting: Family Medicine

## 2023-09-07 ENCOUNTER — Other Ambulatory Visit: Payer: Self-pay | Admitting: Family Medicine

## 2023-09-07 NOTE — Telephone Encounter (Signed)
Copied from CRM [id number]. Topic: General - Call Back - No Documentation >> Sep 07, 2023  8:25 AM Adelina Mings wrote: Reason for CRM: nurse autumn needs to return call

## 2023-09-07 NOTE — Telephone Encounter (Signed)
Patient willing to try additional oral medication and would like it sent to [facility] [address]

## 2023-09-08 ENCOUNTER — Other Ambulatory Visit: Payer: Self-pay | Admitting: Family Medicine

## 2023-09-08 MED ORDER — EMPAGLIFLOZIN 10 MG PO TABS: 10.0000 mg | ORAL_TABLET | Freq: Every day | ORAL | 3 refills | Status: DC

## 2023-09-09 ENCOUNTER — Other Ambulatory Visit: Payer: Self-pay | Admitting: Orthopaedic Surgery

## 2023-11-10 ENCOUNTER — Other Ambulatory Visit: Payer: Self-pay | Admitting: Orthopaedic Surgery

## 2023-11-16 ENCOUNTER — Inpatient Hospital Stay: Payer: 59 | Attending: Hematology

## 2023-11-16 DIAGNOSIS — E538 Deficiency of other specified B group vitamins: Secondary | ICD-10-CM | POA: Diagnosis not present

## 2023-11-16 DIAGNOSIS — N1831 Chronic kidney disease, stage 3a: Secondary | ICD-10-CM | POA: Insufficient documentation

## 2023-11-16 DIAGNOSIS — D509 Iron deficiency anemia, unspecified: Secondary | ICD-10-CM | POA: Insufficient documentation

## 2023-11-16 DIAGNOSIS — R768 Other specified abnormal immunological findings in serum: Secondary | ICD-10-CM | POA: Insufficient documentation

## 2023-11-16 DIAGNOSIS — D508 Other iron deficiency anemias: Secondary | ICD-10-CM

## 2023-11-16 LAB — CBC WITH DIFFERENTIAL/PLATELET
Abs Immature Granulocytes: 0.05 10*3/uL (ref 0.00–0.07)
Basophils Absolute: 0 10*3/uL (ref 0.0–0.1)
Basophils Relative: 0 %
Eosinophils Absolute: 0.2 10*3/uL (ref 0.0–0.5)
Eosinophils Relative: 2 %
HCT: 42.2 % (ref 36.0–46.0)
Hemoglobin: 13 g/dL (ref 12.0–15.0)
Immature Granulocytes: 1 %
Lymphocytes Relative: 25 %
Lymphs Abs: 2.4 10*3/uL (ref 0.7–4.0)
MCH: 25.6 pg — ABNORMAL LOW (ref 26.0–34.0)
MCHC: 30.8 g/dL (ref 30.0–36.0)
MCV: 83.1 fL (ref 80.0–100.0)
Monocytes Absolute: 0.7 10*3/uL (ref 0.1–1.0)
Monocytes Relative: 7 %
Neutro Abs: 6 10*3/uL (ref 1.7–7.7)
Neutrophils Relative %: 65 %
Platelets: 352 10*3/uL (ref 150–400)
RBC: 5.08 MIL/uL (ref 3.87–5.11)
RDW: 16.1 % — ABNORMAL HIGH (ref 11.5–15.5)
WBC: 9.3 10*3/uL (ref 4.0–10.5)
nRBC: 0 % (ref 0.0–0.2)

## 2023-11-16 LAB — VITAMIN B12: Vitamin B-12: 944 pg/mL — ABNORMAL HIGH (ref 180–914)

## 2023-11-16 LAB — IRON AND TIBC
Iron: 45 ug/dL (ref 28–170)
Saturation Ratios: 9 % — ABNORMAL LOW (ref 10.4–31.8)
TIBC: 510 ug/dL — ABNORMAL HIGH (ref 250–450)
UIBC: 465 ug/dL

## 2023-11-16 LAB — FERRITIN: Ferritin: 52 ng/mL (ref 11–307)

## 2023-11-18 LAB — METHYLMALONIC ACID, SERUM: Methylmalonic Acid, Quantitative: 340 nmol/L (ref 0–378)

## 2023-11-21 NOTE — Progress Notes (Unsigned)
VIRTUAL VISIT via TELEPHONE NOTE [facility]   I connected with Tracy Kelley  on 11/22/23 at 8:15 AM by telephone and verified that I am speaking with the correct person using two identifiers.  Location: Patient: Home Provider: [facility]   I discussed the limitations, risks, security and privacy concerns of performing an evaluation and management service by telephone and the availability of in person appointments. I also discussed with the patient that there may be a patient responsible charge related to this service. The patient expressed understanding and agreed to proceed.  REASON FOR VISIT:  Follow-up for iron deficiency anemia   PRIOR THERAPY: None   CURRENT THERAPY: Ferrous sulfate 325 mg daily.  INTERVAL HISTORY:  Tracy Kelley is contacted today for follow-up of iron deficiency anemia.  She was last evaluated via telemedicine visit by Rojelio Brenner PA-C on 05/19/2023.  At today's visit, she reports feeling well.   She continues to take vitamin B12 500 mcg daily and ferrous sulfate 325 mg daily, which she is tolerating well.  She reports good energy, resolved paresthesias, and resolved ice pica.   She continues to deny any rectal bleeding, melena, epistaxis, or menstrual bleeding.  She has 90% energy and 100% appetite. She endorses that she is maintaining a stable weight.  REVIEW OF SYSTEMS:   Review of Systems  Constitutional:  Negative for chills, diaphoresis, fever, malaise/fatigue and weight loss.  Respiratory:  Negative for cough and shortness of breath.   Cardiovascular:  Negative for chest pain and palpitations.  Gastrointestinal:  Negative for abdominal pain, blood in stool, melena, nausea and vomiting.  Musculoskeletal:  Joint pain: right shuoulder.  Neurological:  Negative for dizziness and headaches.     PHYSICAL EXAM: (per limitations of virtual telephone visit)  The patient is alert and oriented x 3, exhibiting adequate  mentation, good mood, and ability to speak in full sentences and execute sound judgement.  ASSESSMENT & PLAN:   Normocytic anemia (iron deficiency + B12 deficiency) - Seen at the request of her PCP (Dr. Everlene Other) for evaluation of microcytic anemia - Onset of anemia in January 2023 was around the same time as her hip replacement surgery. - CBC (10/14/2022) showed Hgb 10.9/MCV 77 with mildly elevated platelets 426. - Iron panel (10/20/2022) shows severe iron deficiency with ferritin 33, iron sat 9%, and elevated TIBC 601.  - No history of blood transfusion or donation.  Does not take any PPI, blood thinner, or NSAID medications. - Colonoscopy (12/17/2022): Nonbleeding internal hemorrhoids, polyp x 1 (tubular adenoma) - Additional hematology workup (01/14/2023):  B12 deficiency (B12 125, MMA 548).  Normal copper, folate, LDH. 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  SPEP negative for M spike.  Mildly elevated kappa light chain 37.9, normal lambda, minimally elevated free light chain ratio 1.67. - Taking B12 500 mcg daily + ferrous sulfate 325 mg daily  - Postmenopausal (LMP 03/26/2019). No rectal bleeding, melena, epistaxis. - Fatigue, pica, and paresthesias resolved after B12 and iron replacement - Most recent labs (11/16/2023): Hgb 13.0/MCV 83.1.  Normal platelets/WBC. Ferritin 52, Iron saturation 9%, elevated TIBC 510 Vitamin B12 normal at 944, normal MMA.  - DIFFERENTIAL DIAGNOSIS favors multifactorial anemia from iron deficiency, B12 deficiency, and CKD - PLAN: Patient would qualify for IV iron, but since she is not anemic and is asymptomatic, she would like to avoid IV iron infusions at this time. -  Continue ferrous sulfate 325 mg QAM w/ orange juice - Continue B12 500 mcg daily - Labs in 6 months = CBC/D, ferritin, iron/TIBC, B12, MMA.  Will also check repeat MGUS/myeloma panel due to  history of elevated free light chain ratio. - OFFICE visit in 6 months (1 week after labs)  2.  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 She drinks occasional social alcohol a few times each year, approximately 3-4 drinks in each sitting. She denies any tobacco or illicit drug use.  - FAMILY: No family history of blood disorders.  Her father had prostate cancer.  Maternal grandmother had ovarian cancer.  Maternal great-great-aunts x 2 had breast cancer.  PLAN SUMMARY: >> LABS in 6 months = CBC/D, ferritin, iron/TIBC, B12, MMA, CMP, SPEP, immunofixation, light chains, LDH  >> OFFICE visit in 6 months (1 week after labs)  ** Last office visit on 01/24/2023     I discussed the assessment and treatment plan with the patient. The patient was provided an opportunity to ask questions and all were answered. The patient agreed with the plan and demonstrated an understanding of the instructions.   The patient was advised to call back or seek an in-person evaluation if the symptoms worsen or if the condition fails to improve as anticipated.  I provided 22 minutes of non-face-to-face time during this encounter, including >10 minutes of medical discussion.  Carnella Guadalajara, PA-C 11/22/23 8:43 AM

## 2023-11-22 ENCOUNTER — Inpatient Hospital Stay: Payer: 59 | Admitting: Physician Assistant

## 2023-11-22 DIAGNOSIS — E538 Deficiency of other specified B group vitamins: Secondary | ICD-10-CM

## 2023-11-22 DIAGNOSIS — R768 Other specified abnormal immunological findings in serum: Secondary | ICD-10-CM | POA: Diagnosis not present

## 2023-11-22 DIAGNOSIS — D508 Other iron deficiency anemias: Secondary | ICD-10-CM

## 2023-11-28 ENCOUNTER — Other Ambulatory Visit: Payer: Self-pay | Admitting: Orthopaedic Surgery

## 2023-12-08 LAB — HM DIABETES EYE EXAM

## 2023-12-28 ENCOUNTER — Other Ambulatory Visit: Payer: Self-pay | Admitting: Orthopaedic Surgery

## 2024-01-09 ENCOUNTER — Other Ambulatory Visit: Payer: Self-pay | Admitting: Family Medicine

## 2024-01-23 ENCOUNTER — Other Ambulatory Visit: Payer: Self-pay | Admitting: Family Medicine

## 2024-02-07 ENCOUNTER — Other Ambulatory Visit: Payer: Self-pay | Admitting: Family Medicine

## 2024-02-07 DIAGNOSIS — E785 Hyperlipidemia, unspecified: Secondary | ICD-10-CM

## 2024-02-07 DIAGNOSIS — E1122 Type 2 diabetes mellitus with diabetic chronic kidney disease: Secondary | ICD-10-CM

## 2024-02-08 ENCOUNTER — Other Ambulatory Visit: Payer: Self-pay | Admitting: Family Medicine

## 2024-02-08 DIAGNOSIS — I10 Essential (primary) hypertension: Secondary | ICD-10-CM

## 2024-02-21 ENCOUNTER — Ambulatory Visit: Payer: 59 | Admitting: Family Medicine

## 2024-02-21 VITALS — BP 127/83 | HR 100 | Temp 98.2°F | Ht 69.0 in | Wt 230.0 lb

## 2024-02-21 DIAGNOSIS — E1165 Type 2 diabetes mellitus with hyperglycemia: Secondary | ICD-10-CM | POA: Diagnosis not present

## 2024-02-21 DIAGNOSIS — I10 Essential (primary) hypertension: Secondary | ICD-10-CM

## 2024-02-21 DIAGNOSIS — E1169 Type 2 diabetes mellitus with other specified complication: Secondary | ICD-10-CM

## 2024-02-21 DIAGNOSIS — E785 Hyperlipidemia, unspecified: Secondary | ICD-10-CM | POA: Diagnosis not present

## 2024-02-21 DIAGNOSIS — Z7984 Long term (current) use of oral hypoglycemic drugs: Secondary | ICD-10-CM

## 2024-02-21 DIAGNOSIS — E1159 Type 2 diabetes mellitus with other circulatory complications: Secondary | ICD-10-CM | POA: Diagnosis not present

## 2024-02-21 MED ORDER — EMPAGLIFLOZIN 10 MG PO TABS: 10.0000 mg | ORAL_TABLET | Freq: Every day | ORAL | 3 refills | Status: DC

## 2024-02-21 NOTE — Patient Instructions (Signed)
Continue your medications.  Labs today.  Follow up in 3 months.  

## 2024-02-21 NOTE — Progress Notes (Unsigned)
 Subjective:  Patient ID: Tracy Kelley, female    DOB: Aug 26, 1971  Age: 53 y.o. MRN: [id number]  CC:  Follow up   HPI:  Patient Active Problem List   Diagnosis Date Noted  . Type 2 diabetes mellitus with hyperglycemia (HCC) 02/21/2024  . Sleep disturbance 07/04/2023  . B12 deficiency 01/24/2023  . Hypercalcemia 01/11/2023  . Hyperlipidemia 03/02/2022  . Status post total replacement of right hip 11/10/2021  . Foraminal stenosis of lumbar region 12/02/2020  . Essential hypertension 11/05/2016    Social Hx   Social History   Socioeconomic History  . Marital status: Divorced    Spouse name: Not on file  . Number of children: 4  . Years of education: 37  . Highest education level: Not on file  Occupational History  . Occupation: cust Designer, multimedia: [employer]  Tobacco Use  . Smoking status: Former    Types: Cigarettes  . Smokeless tobacco: Never  Vaping Use  . Vaping status: Never Used  Substance and Sexual Activity  . Alcohol use: Yes    Alcohol/week: 3.0 standard drinks of alcohol    Types: 3 Glasses of wine per week    Comment: occasional wine  . Drug use: No  . Sexual activity: Yes    Birth control/protection: Post-menopausal    Comment: separated  Other Topics Concern  . Not on file  Social History Narrative   Separated   Works for [employer]   4 boys   2 at home   Social Drivers of Health   Financial Resource Strain: Not on file  Food Insecurity: No Food Insecurity (11/18/2022)   Hunger Vital Sign   . Worried About Programme researcher, broadcasting/film/video in the Last Year: Never true   . Ran Out of Food in the Last Year: Never true  Transportation Needs: No Transportation Needs (11/18/2022)   PRAPARE - Transportation   . Lack of Transportation (Medical): No   . Lack of Transportation (Non-Medical): No  Physical Activity: Not on file  Stress: Not on file  Social Connections: Not on file    Review of Systems Per HPI  Objective:  BP 127/83    Pulse 100   Temp 98.2 F (36.8 C)   Ht 5\' 9"  (1.753 m)   Wt 230 lb (104.3 kg)   LMP 03/26/2019   SpO2 98%   BMI 33.97 kg/m      02/21/2024    1:59 PM 08/23/2023    2:37 PM 07/01/2023   10:45 AM  BP/Weight  Systolic BP 127 130 116  Diastolic BP 83 80 50  Wt. (Lbs) 230 231.2 228  BMI 33.97 kg/m2 34.14 kg/m2 33.67 kg/m2    Physical Exam  Lab Results  Component Value Date   WBC 9.3 11/16/2023   HGB 13.0 11/16/2023   HCT 42.2 11/16/2023   PLT 352 11/16/2023   GLUCOSE 128 (H) 05/12/2023   CHOL 124 01/11/2023   TRIG 108 01/11/2023   HDL 41 01/11/2023   LDLCALC 63 01/11/2023   ALT 15 05/12/2023   AST 19 05/12/2023   NA 138 05/12/2023   K 3.9 05/12/2023   CL 103 05/12/2023   CREATININE 1.11 (H) 05/12/2023   BUN 17 05/12/2023   CO2 29 05/12/2023   TSH 1.860 01/14/2021   HGBA1C 8.4 (H) 09/05/2023   MICROALBUR 1.0 05/11/2017     Assessment & Plan:  Type 2 diabetes mellitus with hyperglycemia, without long-term current use of  insulin  (HCC) -     CMP14+EGFR -     Hemoglobin A1c -     Microalbumin / creatinine urine ratio  Hyperlipidemia, unspecified hyperlipidemia type -     Lipid panel  Other orders -     Empagliflozin ; Take 1 tablet (10 mg total) by mouth daily.  Dispense: 90 tablet; Refill: 3    Follow-up:  No follow-ups on file.  Kathleen Papa DO [facility]

## 2024-02-22 ENCOUNTER — Other Ambulatory Visit: Payer: Self-pay | Admitting: Orthopaedic Surgery

## 2024-02-22 NOTE — Assessment & Plan Note (Signed)
 Lipid panel ordered.  Has been at goal.  Continue Crestor  and fenofibrate .

## 2024-02-22 NOTE — Assessment & Plan Note (Signed)
 Awaiting A1c.  Continue Jardiance  and glipizide .

## 2024-02-22 NOTE — Assessment & Plan Note (Signed)
 At goal. Continue amlodipine and losartan.

## 2024-02-29 LAB — LIPID PANEL
Chol/HDL Ratio: 3.1 ratio (ref 0.0–4.4)
Cholesterol, Total: 115 mg/dL (ref 100–199)
HDL: 37 mg/dL — ABNORMAL LOW (ref >39)
LDL Chol Calc (NIH): 52 mg/dL (ref 0–99)
Triglycerides: 150 mg/dL — ABNORMAL HIGH (ref 0–149)
VLDL Cholesterol Cal: 26 mg/dL (ref 5–40)

## 2024-02-29 LAB — CMP14+EGFR
ALT: 9 IU/L (ref 0–32)
AST: 13 IU/L (ref 0–40)
Albumin: 4.3 g/dL (ref 3.8–4.9)
Alkaline Phosphatase: 117 IU/L (ref 44–121)
BUN/Creatinine Ratio: 12 (ref 9–23)
BUN: 11 mg/dL (ref 6–24)
Bilirubin Total: 0.3 mg/dL (ref 0.0–1.2)
CO2: 24 mmol/L (ref 20–29)
Calcium: 10.6 mg/dL — ABNORMAL HIGH (ref 8.7–10.2)
Chloride: 101 mmol/L (ref 96–106)
Creatinine, Ser: 0.92 mg/dL (ref 0.57–1.00)
Globulin, Total: 3.1 g/dL (ref 1.5–4.5)
Glucose: 121 mg/dL — ABNORMAL HIGH (ref 70–99)
Potassium: 4.4 mmol/L (ref 3.5–5.2)
Sodium: 140 mmol/L (ref 134–144)
Total Protein: 7.4 g/dL (ref 6.0–8.5)
eGFR: 75 mL/min/{1.73_m2} (ref >59)

## 2024-02-29 LAB — MICROALBUMIN / CREATININE URINE RATIO
Creatinine, Urine: 209.1 mg/dL
Microalb/Creat Ratio: 7 mg/g{creat} (ref 0–29)
Microalbumin, Urine: 13.7 ug/mL

## 2024-02-29 LAB — HEMOGLOBIN A1C
Est. average glucose Bld gHb Est-mCnc: 146 mg/dL
Hgb A1c MFr Bld: 6.7 % — ABNORMAL HIGH (ref 4.8–5.6)

## 2024-03-04 ENCOUNTER — Encounter: Payer: Self-pay | Admitting: Family Medicine

## 2024-03-09 ENCOUNTER — Ambulatory Visit: Payer: Self-pay

## 2024-04-09 ENCOUNTER — Encounter: Payer: Self-pay | Admitting: Hematology

## 2024-04-09 ENCOUNTER — Ambulatory Visit: Admitting: Family Medicine

## 2024-04-09 VITALS — BP 123/82 | HR 105 | Temp 98.2°F | Ht 69.0 in | Wt 225.8 lb

## 2024-04-09 DIAGNOSIS — N950 Postmenopausal bleeding: Secondary | ICD-10-CM | POA: Diagnosis not present

## 2024-04-09 NOTE — Patient Instructions (Signed)
 Arranging US  and appt with GYN.  Take care  Dr. Debrah Fan

## 2024-04-09 NOTE — Progress Notes (Signed)
 Subjective:  Patient ID: Tracy Kelley, female    DOB: 1971/02/27  Age: 53 y.o. MRN: [id number]  CC:  Vaginal bleeding   HPI:  53 year old female presents for evaluation of the above.  Patient states that her last menstrual cycle was approximately 3 years ago.  Patient states that she has some spotting last month.  She states that she has been having bleeding and associated cramping since Saturday.  She states that the flow is similar to prior menstrual cycles.  No recent intercourse.  No other associated symptoms.  Patient has looked this up online and is worried.   Patient Active Problem List   Diagnosis Date Noted   Post-menopausal bleeding 04/09/2024   Type 2 diabetes mellitus with hyperglycemia (HCC) 02/21/2024   Sleep disturbance 07/04/2023   B12 deficiency 01/24/2023   Hyperlipidemia 03/02/2022   Status post total replacement of right hip 11/10/2021   Foraminal stenosis of lumbar region 12/02/2020   Essential hypertension 11/05/2016    Social Hx   Social History   Socioeconomic History   Marital status: Divorced    Spouse name: Not on file   Number of children: 4   Years of education: 13   Highest education level: Not on file  Occupational History   Occupation: cust service    Employer: [employer]  Tobacco Use   Smoking status: Former    Types: Cigarettes   Smokeless tobacco: Never  Vaping Use   Vaping status: Never Used  Substance and Sexual Activity   Alcohol use: Yes    Alcohol/week: 3.0 standard drinks of alcohol    Types: 3 Glasses of wine per week    Comment: occasional wine   Drug use: No   Sexual activity: Yes    Birth control/protection: Post-menopausal    Comment: separated  Other Topics Concern   Not on file  Social History Narrative   Separated   Works for [employer] [place] [employer]   4 boys   2 at home   Social Drivers of Corporate investment banker Strain: Not on file  Food Insecurity: No Food Insecurity (11/18/2022)   Hunger Vital  Sign    Worried About Running Out of Food in the Last Year: Never true    Ran Out of Food in the Last Year: Never true  Transportation Needs: No Transportation Needs (11/18/2022)   PRAPARE - Administrator, Civil Service (Medical): No    Lack of Transportation (Non-Medical): No  Physical Activity: Not on file  Stress: Not on file  Social Connections: Not on file    Review of Systems Per HPI  Objective:  BP 123/82   Pulse (!) 105   Temp 98.2 F (36.8 C)   Ht 5' 9 (1.753 m)   Wt 225 lb 12.8 oz (102.4 kg)   LMP 03/26/2019   SpO2 98%   BMI 33.34 kg/m      04/09/2024    3:19 PM 02/21/2024    1:59 PM 08/23/2023    2:37 PM  BP/Weight  Systolic BP 123 127 130  Diastolic BP 82 83 80  Wt. (Lbs) 225.8 230 231.2  BMI 33.34 kg/m2 33.97 kg/m2 34.14 kg/m2    Physical Exam Vitals and nursing note reviewed.  Constitutional:      Appearance: Normal appearance. She is obese.  HENT:     Head: Normocephalic and atraumatic.   Cardiovascular:     Rate and Rhythm: Normal rate and regular rhythm.  Pulmonary:  Effort: Pulmonary effort is normal.     Breath sounds: Normal breath sounds.  Abdominal:     General: There is no distension.     Palpations: Abdomen is soft.     Tenderness: There is no abdominal tenderness.   Neurological:     Mental Status: She is alert.     Lab Results  Component Value Date   WBC 9.3 11/16/2023   HGB 13.0 11/16/2023   HCT 42.2 11/16/2023   PLT 352 11/16/2023   GLUCOSE 121 (H) 02/28/2024   CHOL 115 02/28/2024   TRIG 150 (H) 02/28/2024   HDL 37 (L) 02/28/2024   LDLCALC 52 02/28/2024   ALT 9 02/28/2024   AST 13 02/28/2024   NA 140 02/28/2024   K 4.4 02/28/2024   CL 101 02/28/2024   CREATININE 0.92 02/28/2024   BUN 11 02/28/2024   CO2 24 02/28/2024   TSH 1.860 01/14/2021   HGBA1C 6.7 (H) 02/28/2024   MICROALBUR 1.0 05/11/2017     Assessment & Plan:  Post-menopausal bleeding Assessment & Plan: Ultrasound for further  evaluation.  Will need to see OB/GYN for evaluation and likely biopsy.  Orders: -     US  PELVIC COMPLETE WITH TRANSVAGINAL    Follow-up:  Pending US   Kathleen Papa DO [facility]

## 2024-04-09 NOTE — Assessment & Plan Note (Signed)
 Ultrasound for further evaluation.  Will need to see OB/GYN for evaluation and likely biopsy.

## 2024-04-11 ENCOUNTER — Ambulatory Visit (HOSPITAL_COMMUNITY)
Admission: RE | Admit: 2024-04-11 | Discharge: 2024-04-11 | Disposition: A | Discharge status: Home / Self Care | Source: Ambulatory Visit | Attending: Family Medicine | Admitting: Family Medicine

## 2024-04-11 ENCOUNTER — Ambulatory Visit: Payer: Self-pay | Admitting: Family Medicine

## 2024-04-11 DIAGNOSIS — N950 Postmenopausal bleeding: Secondary | ICD-10-CM | POA: Diagnosis present

## 2024-04-16 ENCOUNTER — Ambulatory Visit: Admitting: Obstetrics & Gynecology

## 2024-04-16 ENCOUNTER — Encounter: Payer: Self-pay | Admitting: Obstetrics & Gynecology

## 2024-04-16 ENCOUNTER — Other Ambulatory Visit (HOSPITAL_COMMUNITY)
Admission: RE | Admit: 2024-04-16 | Discharge: 2024-04-16 | Disposition: A | Discharge status: Home / Self Care | Source: Ambulatory Visit | Attending: Obstetrics & Gynecology | Admitting: Obstetrics & Gynecology

## 2024-04-16 VITALS — BP 129/89 | Ht 69.0 in | Wt 227.0 lb

## 2024-04-16 DIAGNOSIS — N950 Postmenopausal bleeding: Secondary | ICD-10-CM
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
## 2024-04-17 ENCOUNTER — Other Ambulatory Visit: Payer: Self-pay | Admitting: Orthopaedic Surgery

## 2024-04-18 ENCOUNTER — Ambulatory Visit: Payer: Self-pay | Admitting: Obstetrics & Gynecology

## 2024-04-18 LAB — SURGICAL PATHOLOGY

## 2024-05-01 ENCOUNTER — Encounter: Payer: Self-pay | Admitting: Hematology

## 2024-05-14 ENCOUNTER — Encounter: Payer: Self-pay | Admitting: Hematology

## 2024-05-14 ENCOUNTER — Inpatient Hospital Stay: Payer: 59 | Attending: Hematology

## 2024-05-14 DIAGNOSIS — N1831 Chronic kidney disease, stage 3a: Secondary | ICD-10-CM | POA: Insufficient documentation

## 2024-05-14 DIAGNOSIS — D509 Iron deficiency anemia, unspecified: Secondary | ICD-10-CM | POA: Insufficient documentation

## 2024-05-14 DIAGNOSIS — E538 Deficiency of other specified B group vitamins: Secondary | ICD-10-CM | POA: Diagnosis not present

## 2024-05-14 DIAGNOSIS — R768 Other specified abnormal immunological findings in serum: Secondary | ICD-10-CM | POA: Diagnosis not present

## 2024-05-14 DIAGNOSIS — Z803 Family history of malignant neoplasm of breast: Secondary | ICD-10-CM | POA: Insufficient documentation

## 2024-05-14 DIAGNOSIS — Z8042 Family history of malignant neoplasm of prostate: Secondary | ICD-10-CM | POA: Insufficient documentation

## 2024-05-14 DIAGNOSIS — Z8041 Family history of malignant neoplasm of ovary: Secondary | ICD-10-CM | POA: Diagnosis not present

## 2024-05-14 DIAGNOSIS — D508 Other iron deficiency anemias: Secondary | ICD-10-CM

## 2024-05-14 DIAGNOSIS — Z87891 Personal history of nicotine dependence: Secondary | ICD-10-CM | POA: Insufficient documentation

## 2024-05-14 LAB — COMPREHENSIVE METABOLIC PANEL WITH GFR
ALT: 12 U/L (ref 0–44)
AST: 17 U/L (ref 15–41)
Albumin: 4 g/dL (ref 3.5–5.0)
Alkaline Phosphatase: 82 U/L (ref 38–126)
Anion gap: 11 (ref 5–15)
BUN: 15 mg/dL (ref 6–20)
CO2: 23 mmol/L (ref 22–32)
Calcium: 10.1 mg/dL (ref 8.9–10.3)
Chloride: 102 mmol/L (ref 98–111)
Creatinine, Ser: 1.1 mg/dL — ABNORMAL HIGH (ref 0.44–1.00)
GFR, Estimated: >60 mL/min (ref >60)
Glucose, Bld: 131 mg/dL — ABNORMAL HIGH (ref 70–99)
Potassium: 3.4 mmol/L — ABNORMAL LOW (ref 3.5–5.1)
Sodium: 136 mmol/L (ref 135–145)
Total Bilirubin: 0.5 mg/dL (ref 0.0–1.2)
Total Protein: 8.3 g/dL — ABNORMAL HIGH (ref 6.5–8.1)

## 2024-05-14 LAB — CBC WITH DIFFERENTIAL/PLATELET
Abs Immature Granulocytes: 0.05 K/uL (ref 0.00–0.07)
Basophils Absolute: 0.1 K/uL (ref 0.0–0.1)
Basophils Relative: 1 %
Eosinophils Absolute: 0.2 K/uL (ref 0.0–0.5)
Eosinophils Relative: 2 %
HCT: 44.3 % (ref 36.0–46.0)
Hemoglobin: 13.7 g/dL (ref 12.0–15.0)
Immature Granulocytes: 1 %
Lymphocytes Relative: 28 %
Lymphs Abs: 2.7 K/uL (ref 0.7–4.0)
MCH: 25.9 pg — ABNORMAL LOW (ref 26.0–34.0)
MCHC: 30.9 g/dL (ref 30.0–36.0)
MCV: 83.9 fL (ref 80.0–100.0)
Monocytes Absolute: 0.7 K/uL (ref 0.1–1.0)
Monocytes Relative: 7 %
Neutro Abs: 5.8 K/uL (ref 1.7–7.7)
Neutrophils Relative %: 61 %
Platelets: 331 K/uL (ref 150–400)
RBC: 5.28 MIL/uL — ABNORMAL HIGH (ref 3.87–5.11)
RDW: 17.1 % — ABNORMAL HIGH (ref 11.5–15.5)
WBC: 9.4 K/uL (ref 4.0–10.5)
nRBC: 0 % (ref 0.0–0.2)

## 2024-05-14 LAB — IRON AND TIBC
Iron: 72 ug/dL (ref 28–170)
Saturation Ratios: 16 % (ref 10.4–31.8)
TIBC: 462 ug/dL — ABNORMAL HIGH (ref 250–450)
UIBC: 390 ug/dL

## 2024-05-14 LAB — LACTATE DEHYDROGENASE: LDH: 149 U/L (ref 98–192)

## 2024-05-14 LAB — FERRITIN: Ferritin: 86 ng/mL (ref 11–307)

## 2024-05-14 LAB — VITAMIN B12: Vitamin B-12: 328 pg/mL (ref 180–914)

## 2024-05-15 LAB — KAPPA/LAMBDA LIGHT CHAINS
Kappa free light chain: 41.7 mg/L — ABNORMAL HIGH (ref 3.3–19.4)
Kappa, lambda light chain ratio: 1.6 (ref 0.26–1.65)
Lambda free light chains: 26 mg/L (ref 5.7–26.3)

## 2024-05-16 LAB — PROTEIN ELECTROPHORESIS, SERUM
A/G Ratio: 0.9 (ref 0.7–1.7)
Albumin ELP: 3.6 g/dL (ref 2.9–4.4)
Alpha-1-Globulin: 0.2 g/dL (ref 0.0–0.4)
Alpha-2-Globulin: 0.9 g/dL (ref 0.4–1.0)
Beta Globulin: 1.4 g/dL — ABNORMAL HIGH (ref 0.7–1.3)
Gamma Globulin: 1.5 g/dL (ref 0.4–1.8)
Globulin, Total: 4.1 g/dL — ABNORMAL HIGH (ref 2.2–3.9)
Total Protein ELP: 7.7 g/dL (ref 6.0–8.5)

## 2024-05-16 LAB — METHYLMALONIC ACID, SERUM: Methylmalonic Acid, Quantitative: 308 nmol/L (ref 0–378)

## 2024-05-18 LAB — IMMUNOFIXATION ELECTROPHORESIS
IgA: 454 mg/dL — ABNORMAL HIGH (ref 87–352)
IgG (Immunoglobin G), Serum: 1486 mg/dL (ref 586–1602)
IgM (Immunoglobulin M), Srm: 122 mg/dL (ref 26–217)
Total Protein ELP: 7.8 g/dL (ref 6.0–8.5)
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
## 2024-05-21 ENCOUNTER — Ambulatory Visit: Admitting: Family Medicine

## 2024-05-21 ENCOUNTER — Ambulatory Visit: Payer: 59 | Admitting: Physician Assistant

## 2024-05-21 ENCOUNTER — Inpatient Hospital Stay (HOSPITAL_BASED_OUTPATIENT_CLINIC_OR_DEPARTMENT_OTHER): Payer: 59 | Admitting: Physician Assistant

## 2024-05-21 VITALS — BP 137/90 | HR 102 | Temp 98.3°F | Resp 19 | Ht 69.0 in | Wt 222.0 lb

## 2024-05-21 VITALS — BP 124/76 | HR 111 | Temp 97.9°F | Ht 69.0 in | Wt 223.2 lb

## 2024-05-21 DIAGNOSIS — E785 Hyperlipidemia, unspecified: Secondary | ICD-10-CM | POA: Diagnosis not present

## 2024-05-21 DIAGNOSIS — Z8042 Family history of malignant neoplasm of prostate: Secondary | ICD-10-CM | POA: Diagnosis not present

## 2024-05-21 DIAGNOSIS — E1165 Type 2 diabetes mellitus with hyperglycemia: Secondary | ICD-10-CM | POA: Diagnosis not present

## 2024-05-21 DIAGNOSIS — I10 Essential (primary) hypertension: Secondary | ICD-10-CM | POA: Diagnosis not present

## 2024-05-21 DIAGNOSIS — D508 Other iron deficiency anemias: Secondary | ICD-10-CM

## 2024-05-21 DIAGNOSIS — Z7984 Long term (current) use of oral hypoglycemic drugs: Secondary | ICD-10-CM

## 2024-05-21 DIAGNOSIS — Z87891 Personal history of nicotine dependence: Secondary | ICD-10-CM | POA: Diagnosis not present

## 2024-05-21 DIAGNOSIS — Z8041 Family history of malignant neoplasm of ovary: Secondary | ICD-10-CM | POA: Diagnosis not present

## 2024-05-21 DIAGNOSIS — E538 Deficiency of other specified B group vitamins: Secondary | ICD-10-CM

## 2024-05-21 DIAGNOSIS — N1831 Chronic kidney disease, stage 3a: Secondary | ICD-10-CM | POA: Diagnosis not present

## 2024-05-21 DIAGNOSIS — R768 Other specified abnormal immunological findings in serum: Secondary | ICD-10-CM | POA: Diagnosis not present

## 2024-05-21 DIAGNOSIS — D509 Iron deficiency anemia, unspecified: Secondary | ICD-10-CM | POA: Diagnosis not present

## 2024-05-21 DIAGNOSIS — Z803 Family history of malignant neoplasm of breast: Secondary | ICD-10-CM | POA: Diagnosis not present

## 2024-05-21 MED ORDER — ROSUVASTATIN CALCIUM 10 MG PO TABS: 10.0000 mg | ORAL_TABLET | Freq: Every day | ORAL | 3 refills | Status: DC

## 2024-05-21 NOTE — Assessment & Plan Note (Signed)
At goal.  Continue Jardiance and glipizide.

## 2024-05-21 NOTE — Assessment & Plan Note (Signed)
Stable.  At goal.  Continue current medications.

## 2024-05-21 NOTE — Patient Instructions (Signed)
 [facility] at [facility] **VISIT SUMMARY & IMPORTANT INSTRUCTIONS **   You were seen today by Pleasant Barefoot PA-C for your anemia.    ANEMIA: Your blood levels are back to normal!  You are no longer anemic.  Continue taking iron tablet once daily.  Take this in the morning with a glass of orange juice to improve absorption. Continue to take vitamin B12 500 mcg daily.  FOLLOW-UP APPOINTMENT: You do not need to schedule any follow-up appointments at the Cancer Center/hematology clinic at this time.  We will discharge you to the care of your primary care provider.  If you have any worsening iron deficiency anemia in the future and require any IV iron infusions, you can be referred back to us  on an as-needed basis.  ** Thank you for trusting me with your healthcare!  I strive to provide all of my patients with quality care at each visit.  If you receive a survey for this visit, I would be so grateful to you for taking the time to provide feedback.  Thank you in advance!  ~ Rozalia Dino                   Dr. Alean Stands   &   Pleasant Barefoot, PA-C   - - - - - - - - - - - - - - - - - -    Thank you for choosing [facility] at [facility] to provide your oncology and hematology care.  To afford each patient quality time with our provider, please arrive at least 15 minutes before your scheduled appointment time.   If you have a lab appointment with the Cancer Center please come in thru the Main Entrance and check in at the main information desk.  You need to re-schedule your appointment should you arrive 10 or more minutes late.  We strive to give you quality time with our providers, and arriving late affects you and other patients whose appointments are after yours.  Also, if you no show three or more times for appointments you may be dismissed from the clinic at the providers discretion.     Again, thank you for choosing [facility].  Our hope is that these requests will decrease the amount of time that you wait before being seen by our physicians.       _____________________________________________________________  Should you have questions after your visit to [facility], please contact our office at [phone] and follow the prompts.  Our office hours are 8:00 a.m. and 4:30 p.m. Monday - Friday.  Please note that voicemails left after 4:00 p.m. may not be returned until the following business day.  We are closed weekends and major holidays.  You do have access to a nurse 24-7, just call the main number to the clinic [phone] and do not press any options, hold on the line and a nurse will answer the phone.    For prescription refill requests, have your pharmacy contact our office and allow 72 hours.

## 2024-05-21 NOTE — Progress Notes (Signed)
 Subjective:  Patient ID: Tracy Kelley, female    DOB: 11-03-1970  Age: 53 y.o. MRN: [id number]  CC: Follow-up   HPI:  53 year old female with hypertension, type 2 diabetes, B12 deficiency, iron deficiency anemia, and hyperlipidemia presents for follow-up.  Seen by hematology today.  Anemia resolved.  She has been released back to my care regarding this.  A1c at goal on glipizide  and Jardiance .  Blood pressure at goal on amlodipine  and losartan .  Patient denies chest pain or shortness of breath.  She is feeling well.  She has no complaints or concerns at this time.   Patient Active Problem List   Diagnosis Date Noted   Type 2 diabetes mellitus with hyperglycemia (HCC) 02/21/2024   B12 deficiency 01/24/2023   Hyperlipidemia 03/02/2022   Status post total replacement of right hip 11/10/2021   Foraminal stenosis of lumbar region 12/02/2020   Essential hypertension 11/05/2016    Social Hx   Social History   Socioeconomic History   Marital status: Divorced    Spouse name: Not on file   Number of children: 4   Years of education: 13   Highest education level: Not on file  Occupational History   Occupation: cust service    Employer: [employer]  Tobacco Use   Smoking status: Former    Types: Cigarettes   Smokeless tobacco: Never  Vaping Use   Vaping status: Never Used  Substance and Sexual Activity   Alcohol use: Yes    Alcohol/week: 3.0 standard drinks of alcohol    Types: 3 Glasses of wine per week    Comment: occasional wine   Drug use: No   Sexual activity: Not Currently    Birth control/protection: Post-menopausal    Comment: separated  Other Topics Concern   Not on file  Social History Narrative   Separated   Works for [employer]   4 boys   2 at home   Social Drivers of Health   Financial Resource Strain: Low Risk  (04/16/2024)   Overall Financial Resource Strain (CARDIA)    Difficulty of Paying Living Expenses: Not hard at all  Food  Insecurity: No Food Insecurity (04/16/2024)   Hunger Vital Sign    Worried About Running Out of Food in the Last Year: Never true    Ran Out of Food in the Last Year: Never true  Transportation Needs: No Transportation Needs (04/16/2024)   PRAPARE - Administrator, Civil Service (Medical): No    Lack of Transportation (Non-Medical): No  Physical Activity: Inactive (04/16/2024)   Exercise Vital Sign    Days of Exercise per Week: 0 days    Minutes of Exercise per Session: 0 min  Stress: No Stress Concern Present (04/16/2024)   [employer] of Occupational Health - Occupational Stress Questionnaire    Feeling of Stress: Not at all  Social Connections: Moderately Isolated (04/16/2024)   Social Connection and Isolation Panel    Frequency of Communication with Friends and Family: Three times a week    Frequency of Social Gatherings with Friends and Family: Once a week    Attends Religious Services: 1 to 4 times per year    Active Member of [employer] or Organizations: No    Attends Banker Meetings: Never    Marital Status: Divorced    Review of Systems Per HPI  Objective:  BP 124/76   Pulse (!) 111   Temp 97.9 F (36.6 C)   Ht  5' 9 (1.753 m)   Wt 223 lb 3.2 oz (101.2 kg)   LMP 03/26/2019   SpO2 96%   BMI 32.96 kg/m      05/21/2024   11:08 AM 05/21/2024    8:14 AM 04/16/2024    3:54 PM  BP/Weight  Systolic BP 124 137 129  Diastolic BP 76 90 89  Wt. (Lbs) 223.2 222   BMI 32.96 kg/m2 32.78 kg/m2     Physical Exam Vitals and nursing note reviewed.  Constitutional:      General: She is not in acute distress.    Appearance: Normal appearance.  HENT:     Head: Normocephalic and atraumatic.  Eyes:     General:        Right eye: No discharge.        Left eye: No discharge.     Conjunctiva/sclera: Conjunctivae normal.  Cardiovascular:     Rate and Rhythm: Normal rate and regular rhythm.  Pulmonary:     Effort: Pulmonary effort is normal.      Breath sounds: Normal breath sounds. No wheezing, rhonchi or rales.  Neurological:     Mental Status: She is alert.  Psychiatric:        Mood and Affect: Mood normal.        Behavior: Behavior normal.     Lab Results  Component Value Date   WBC 9.4 05/14/2024   HGB 13.7 05/14/2024   HCT 44.3 05/14/2024   PLT 331 05/14/2024   GLUCOSE 131 (H) 05/14/2024   CHOL 115 02/28/2024   TRIG 150 (H) 02/28/2024   HDL 37 (L) 02/28/2024   LDLCALC 52 02/28/2024   ALT 12 05/14/2024   AST 17 05/14/2024   NA 136 05/14/2024   K 3.4 (L) 05/14/2024   CL 102 05/14/2024   CREATININE 1.10 (H) 05/14/2024   BUN 15 05/14/2024   CO2 23 05/14/2024   TSH 1.860 01/14/2021   HGBA1C 6.7 (H) 02/28/2024   MICROALBUR 1.0 05/11/2017     Assessment & Plan:  Essential hypertension Assessment & Plan: Stable.  At goal.  Continue current medications.   Type 2 diabetes mellitus with hyperglycemia, without long-term current use of insulin  (HCC) Assessment & Plan: At goal.  Continue Jardiance  and glipizide .   Hyperlipidemia, unspecified hyperlipidemia type Assessment & Plan: LDL at goal.  Continue Crestor .  Orders: -     Rosuvastatin  Calcium ; Take 1 tablet (10 mg total) by mouth daily.  Dispense: 90 tablet; Refill: 3    Follow-up: 6 months  Samanthamarie Ezzell Bluford DO [facility]

## 2024-05-21 NOTE — Patient Instructions (Signed)
You're doing well    Follow-up in 6 months

## 2024-05-21 NOTE — Assessment & Plan Note (Signed)
LDL at goal.  Continue Crestor. 

## 2024-05-22 ENCOUNTER — Other Ambulatory Visit: Payer: Self-pay | Admitting: Family Medicine

## 2024-06-05 ENCOUNTER — Other Ambulatory Visit: Payer: Self-pay | Admitting: *Deleted

## 2024-06-15 ENCOUNTER — Encounter: Payer: Self-pay | Admitting: Radiology

## 2024-06-26 ENCOUNTER — Other Ambulatory Visit: Payer: Self-pay | Admitting: Orthopaedic Surgery

## 2024-06-29 ENCOUNTER — Other Ambulatory Visit (HOSPITAL_COMMUNITY): Payer: Self-pay | Admitting: Family Medicine

## 2024-06-29 DIAGNOSIS — Z1231 Encounter for screening mammogram for malignant neoplasm of breast: Secondary | ICD-10-CM

## 2024-07-19 ENCOUNTER — Encounter: Admitting: Nurse Practitioner

## 2024-08-03 ENCOUNTER — Ambulatory Visit: Admitting: Nurse Practitioner

## 2024-08-03 VITALS — BP 119/80 | Ht 69.0 in | Wt 230.0 lb

## 2024-08-03 DIAGNOSIS — Z01411 Encounter for gynecological examination (general) (routine) with abnormal findings: Secondary | ICD-10-CM

## 2024-08-03 DIAGNOSIS — H0011 Chalazion right upper eyelid: Secondary | ICD-10-CM | POA: Insufficient documentation

## 2024-08-03 DIAGNOSIS — Z01419 Encounter for gynecological examination (general) (routine) without abnormal findings: Secondary | ICD-10-CM

## 2024-08-03 MED ORDER — CEPHALEXIN 500 MG PO CAPS: 500.0000 mg | ORAL_CAPSULE | Freq: Three times a day (TID) | ORAL | 0 refills | Status: DC

## 2024-08-03 NOTE — Progress Notes (Signed)
 Subjective:    Patient ID: Tracy Kelley, female    DOB: 03-14-1971, 53 y.o.   MRN: [id number] CC: Well woman exam  HPI 53 year old female arrived for her annual exam. She only had one concern today and that was a small bump on her right upper lid that began yesterday. It is uncomfortable but does not interfere with vision, cause pain or discharge. She is working on incorporating a healthy diet and participates in chair yoga. She is getting about 6-7 hours of sleep nightly. Patient stated that her eye exam and dental exams were up to date. Her mammogram is currently schedule for 08/20/24 and her last cervical screening was in 2024 and negative. She states that she has the same female partner and does not have any concerns for STI.  Gets regular follow up with Dr. Bluford and hematology. Labs up to date.   Review of Systems  Constitutional:  Negative for activity change, appetite change, fatigue and fever.  HENT:  Negative for sore throat and trouble swallowing.   Eyes:  Positive for redness. Negative for pain and discharge.       Right upper lid edema  Respiratory:  Negative for cough, chest tightness, shortness of breath and wheezing.   Cardiovascular:  Negative for chest pain.  Gastrointestinal:  Negative for abdominal distention, abdominal pain, constipation, diarrhea, nausea and vomiting.  Genitourinary:  Negative for difficulty urinating, dysuria, enuresis, frequency, genital sores, pelvic pain, urgency, vaginal bleeding and vaginal discharge.       Denies vulvar rash, itching, irritation or lesions.      08/03/2024    1:14 PM  Depression screen PHQ 2/9  Decreased Interest 0  Down, Depressed, Hopeless 0  PHQ - 2 Score 0          Objective:   Physical Exam Vitals and nursing note reviewed. Exam conducted with a chaperone present.  Constitutional:      General: She is not in acute distress.    Appearance: She is well-developed.  Eyes:     General:        Right eye: No  discharge.     Pupils: Pupils are equal, round, and reactive to light.     Comments: Conjunctivae: minimal injection bilaterally. Rubbery non tender nodule noted mid right upper eyelid.   Neck:     Thyroid : No thyromegaly.     Trachea: No tracheal deviation.     Comments: Thyroid  soft and nontender. No mass detected.  Cardiovascular:     Rate and Rhythm: Normal rate and regular rhythm.     Heart sounds: Normal heart sounds. No murmur heard. Pulmonary:     Effort: Pulmonary effort is normal.     Breath sounds: Normal breath sounds.  Chest:  Breasts:    Right: No swelling, inverted nipple, mass, skin change or tenderness.     Left: No swelling, inverted nipple, mass, skin change or tenderness.  Abdominal:     General: There is no distension.     Palpations: Abdomen is soft.     Tenderness: There is no abdominal tenderness.  Genitourinary:    Comments: Defers GU exam.  Musculoskeletal:     Cervical back: Normal range of motion and neck supple. No tenderness.  Lymphadenopathy:     Cervical: No cervical adenopathy.     Upper Body:     Right upper body: No supraclavicular, axillary or pectoral adenopathy.     Left upper body: No supraclavicular, axillary or pectoral adenopathy.  Skin:    General: Skin is warm and dry.  Neurological:     Mental Status: She is alert and oriented to person, place, and time.  Psychiatric:        Mood and Affect: Mood normal.        Behavior: Behavior normal.        Thought Content: Thought content normal.        Judgment: Judgment normal.    Vitals:   08/03/24 1313  BP: 119/80  Height: 5' 9 (1.753 m)  Weight: 104.3 kg  BMI (Calculated): 33.96          Assessment & Plan:  1. Well woman exam (Primary) Patient educated on developing healthy diets and to continue to participate in physical activity.  Patient is able to set up a nursing visit for her flu vaccine.  Maintain follow up visit with PCP in January.   2. Chalazion of right upper  eyelid Utilize warm compresses. Patient instructed to contact office if eyelid and or the area around the eye becomes swollen and tender. Given prescription for Keflex to start if worse over the weekend.  - cephALEXin (KEFLEX) 500 MG capsule; Take 1 capsule (500 mg total) by mouth 3 (three) times daily.  Dispense: 21 capsule; Refill: 0   Return in about 1 year (around 08/03/2025) for physical.   I have seen and examined this patient alongside the NP student. I have reviewed and verified the student note and agree with the assessment and plan.  Elveria Quarry, FNP

## 2024-08-03 NOTE — Patient Instructions (Signed)
 Chalazion  A chalazion is a swelling or lump on the eyelid. It can affect the upper eyelid or the lower eyelid. What are the causes? This condition may be caused by: Long-lasting (chronic) inflammation of the eyelid glands. A blocked oil gland in the eyelid. What are the signs or symptoms? Symptoms of this condition include: Swelling of the eyelid that: May spread to areas around the eye. May be painful. A hard lump on the eyelid. Blurry vision. The lump may make it hard to see out of the eye. How is this diagnosed? This condition is diagnosed with an examination of the eye. How is this treated? This condition is treated by applying a warm, moist cloth (warm compress) to the eyelid. If the condition does not improve, it may be treated with: Medicine that is applied to the eye. Oral medicines. Medicine that is injected into the chalazion. Surgery. Follow these instructions at home: Managing pain and swelling Apply a warm compress to the eyelid for 10-15 minutes, 4 to 6 times a day. This will help to open any blocked glands and to reduce redness and swelling. Take and apply over-the-counter and prescription medicines only as told by your health care provider. General instructions Do not touch the chalazion. Do not try to remove the pus. Do not squeeze the chalazion or stick it with a pin or needle. Do not rub your eyes. Wash your hands often with soap and water for at least 20 seconds. Dry your hands with a clean towel. Keep your face, scalp, and eyebrows clean. Avoid wearing eye makeup. Keep all follow-up visits. This is important. Contact a health care provider if: Your eyelid is getting worse. You have a fever. The chalazion does not break open (rupture) or go away on its own and your eyelid has not improved for 4 weeks. Get help right away if: You have pain in your eye. Your vision worsens. The chalazion becomes painful or red. The chalazion gets bigger. Summary A  chalazion is a swelling or lump on the upper or lower eyelid. It may be caused by chronic inflammation or a blocked oil gland. Apply a warm compress to the eyelid for 10-15 minutes, 4 to 6 times a day. Keep your face, scalp, and eyebrows clean. This information is not intended to replace advice given to you by your health care provider. Make sure you discuss any questions you have with your health care provider. Document Revised: 12/17/2020 Document Reviewed: 12/17/2020 Elsevier Patient Education  2024 [employer].

## 2024-08-05 ENCOUNTER — Encounter: Payer: Self-pay | Admitting: Nurse Practitioner

## 2024-08-13 ENCOUNTER — Other Ambulatory Visit: Payer: Self-pay | Admitting: Family Medicine

## 2024-08-13 DIAGNOSIS — N1831 Chronic kidney disease, stage 3a: Secondary | ICD-10-CM

## 2024-08-13 DIAGNOSIS — E785 Hyperlipidemia, unspecified: Secondary | ICD-10-CM

## 2024-08-15 ENCOUNTER — Telehealth: Payer: Self-pay

## 2024-08-15 NOTE — Telephone Encounter (Signed)
 Copied from CRM #[id number]. Topic: Clinical - Prescription Issue >> Aug 15, 2024 11:56 AM Avram MATSU wrote: Reason for CRM: a request for patient rx was sent out on 10/20 and pt is still waiting for it to be sent over.

## 2024-08-16 ENCOUNTER — Other Ambulatory Visit: Payer: Self-pay | Admitting: Nurse Practitioner

## 2024-08-16 DIAGNOSIS — N1831 Chronic kidney disease, stage 3a: Secondary | ICD-10-CM

## 2024-08-16 DIAGNOSIS — E785 Hyperlipidemia, unspecified: Secondary | ICD-10-CM

## 2024-08-16 MED ORDER — GLIPIZIDE 5 MG PO TABS: ORAL_TABLET | ORAL | 1 refills | Status: DC

## 2024-08-16 MED ORDER — FENOFIBRATE 160 MG PO TABS: ORAL_TABLET | ORAL | 1 refills | Status: DC

## 2024-08-16 NOTE — Telephone Encounter (Signed)
 Done

## 2024-08-17 ENCOUNTER — Ambulatory Visit (HOSPITAL_COMMUNITY)

## 2024-08-20 ENCOUNTER — Encounter (HOSPITAL_COMMUNITY): Payer: Self-pay

## 2024-08-20 ENCOUNTER — Ambulatory Visit (HOSPITAL_COMMUNITY)
Admission: RE | Admit: 2024-08-20 | Discharge: 2024-08-20 | Disposition: A | Discharge status: Home / Self Care | Source: Ambulatory Visit | Attending: Family Medicine | Admitting: Family Medicine

## 2024-08-20 DIAGNOSIS — Z1231 Encounter for screening mammogram for malignant neoplasm of breast: Secondary | ICD-10-CM | POA: Insufficient documentation

## 2024-08-22 ENCOUNTER — Other Ambulatory Visit: Payer: Self-pay | Admitting: Orthopaedic Surgery

## 2024-08-27 ENCOUNTER — Encounter: Payer: Self-pay | Admitting: Radiology

## 2024-09-17 ENCOUNTER — Other Ambulatory Visit: Payer: Self-pay | Admitting: Orthopaedic Surgery

## 2024-10-24 ENCOUNTER — Other Ambulatory Visit: Payer: Self-pay | Admitting: Orthopaedic Surgery

## 2024-11-21 ENCOUNTER — Ambulatory Visit: Admitting: Family Medicine

## 2024-11-21 ENCOUNTER — Encounter: Payer: Self-pay | Admitting: Family Medicine

## 2024-11-21 VITALS — BP 119/80 | HR 112 | Ht 69.0 in | Wt 229.2 lb

## 2024-11-21 DIAGNOSIS — E538 Deficiency of other specified B group vitamins: Secondary | ICD-10-CM | POA: Diagnosis not present

## 2024-11-21 DIAGNOSIS — Z7984 Long term (current) use of oral hypoglycemic drugs: Secondary | ICD-10-CM | POA: Diagnosis not present

## 2024-11-21 DIAGNOSIS — E611 Iron deficiency: Secondary | ICD-10-CM

## 2024-11-21 DIAGNOSIS — I10 Essential (primary) hypertension: Secondary | ICD-10-CM

## 2024-11-21 DIAGNOSIS — E119 Type 2 diabetes mellitus without complications: Secondary | ICD-10-CM

## 2024-11-21 DIAGNOSIS — E785 Hyperlipidemia, unspecified: Secondary | ICD-10-CM | POA: Diagnosis not present

## 2024-11-21 MED ORDER — EMPAGLIFLOZIN 10 MG PO TABS: 10.0000 mg | ORAL_TABLET | Freq: Every day | ORAL | 3 refills | Status: AC

## 2024-11-21 MED ORDER — LOSARTAN POTASSIUM 50 MG PO TABS: 50.0000 mg | ORAL_TABLET | Freq: Every day | ORAL | 3 refills | Status: AC

## 2024-11-21 MED ORDER — GLIPIZIDE 5 MG PO TABS: ORAL_TABLET | ORAL | 3 refills | Status: AC

## 2024-11-21 MED ORDER — FENOFIBRATE 160 MG PO TABS: ORAL_TABLET | ORAL | 3 refills | Status: AC

## 2024-11-21 MED ORDER — ROSUVASTATIN CALCIUM 10 MG PO TABS: 10.0000 mg | ORAL_TABLET | Freq: Every day | ORAL | 3 refills | Status: AC

## 2024-11-21 MED ORDER — AMLODIPINE BESYLATE 10 MG PO TABS: ORAL_TABLET | ORAL | 3 refills | Status: AC

## 2024-11-21 NOTE — Assessment & Plan Note (Signed)
 At goal. Lipid panel ordered. Continue Crestor .

## 2024-11-21 NOTE — Assessment & Plan Note (Signed)
 Has been at goal. A1c ordered. Continue current medications.

## 2024-11-21 NOTE — Patient Instructions (Signed)
Labs ordered.  Follow up in 6 months.  Take care  Dr. Lareina Espino  

## 2024-11-21 NOTE — Assessment & Plan Note (Signed)
 Stable. Continue Losartan  and Amlodipine .

## 2024-11-21 NOTE — Progress Notes (Signed)
 "  Subjective:  Patient ID: Tracy Kelley, female    DOB: [birth year]  Age: 54 y.o. MRN: [id number]  CC:   Chief Complaint  Patient presents with   Follow-up    No Complaints    HPI:  54 year old female presents for follow up.   She reports recent morning cough but she is otherwise feeling well. No SOB. No fever. No other respiratory symptoms.  HTN stable on Losartan  and Amlodipine .  DM-2 at goal. Needs A1c. Compliant with Jardiance  and Glipizide .  LDL at goal on Crestor .   Renal function stable.  Hematology has released her back to me regarding Iron/B12 deficiency.  Patient Active Problem List   Diagnosis Date Noted   Type 2 diabetes mellitus without complications (HCC) 11/21/2024   B12 deficiency 01/24/2023   Hyperlipidemia 03/02/2022   Status post total replacement of right hip 11/10/2021   Foraminal stenosis of lumbar region 12/02/2020   Essential hypertension 11/05/2016    Social Hx   Social History   Socioeconomic History   Marital status: Divorced    Spouse name: Not on file   Number of children: 4   Years of education: 13   Highest education level: Not on file  Occupational History   Occupation: cust service    Employer: [employer]  Tobacco Use   Smoking status: Former    Types: Cigarettes   Smokeless tobacco: Never  Vaping Use   Vaping status: Never Used  Substance and Sexual Activity   Alcohol use: Yes    Alcohol/week: 3.0 standard drinks of alcohol    Types: 3 Glasses of wine per week    Comment: occasional wine   Drug use: No   Sexual activity: Not Currently    Birth control/protection: Post-menopausal    Comment: separated  Other Topics Concern   Not on file  Social History Narrative   Separated   Works for [employer]   4 boys   2 at home   Social Drivers of Health   Tobacco Use: Medium Risk (11/21/2024)   Patient History    Smoking Tobacco Use: Former    Smokeless Tobacco Use: Never    Passive Exposure: Not on Programmer, Applications Strain: Low Risk (04/16/2024)   Overall Financial Resource Strain (CARDIA)    Difficulty of Paying Living Expenses: Not hard at all  Food Insecurity: No Food Insecurity (04/16/2024)   Epic    Worried About Radiation Protection Practitioner of Food in the Last Year: Never true    Ran Out of Food in the Last Year: Never true  Transportation Needs: No Transportation Needs (04/16/2024)   Epic    Lack of Transportation (Medical): No    Lack of Transportation (Non-Medical): No  Physical Activity: Inactive (04/16/2024)   Exercise Vital Sign    Days of Exercise per Week: 0 days    Minutes of Exercise per Session: 0 min  Stress: No Stress Concern Present (04/16/2024)   [employer] of Occupational Health - Occupational Stress Questionnaire    Feeling of Stress: Not at all  Social Connections: Moderately Isolated (04/16/2024)   Social Connection and Isolation Panel    Frequency of Communication with Friends and Family: Three times a week    Frequency of Social Gatherings with Friends and Family: Once a week    Attends Religious Services: 1 to 4 times per year    Active Member of [employer] or Organizations: No    Attends Banker Meetings:  Never    Marital Status: Divorced  Depression (PHQ2-9): Low Risk (11/21/2024)   Depression (PHQ2-9)    PHQ-2 Score: 0  Alcohol Screen: Low Risk (04/16/2024)   Alcohol Screen    Last Alcohol Screening Score (AUDIT): 2  Housing: Low Risk (04/16/2024)   Epic    Unable to Pay for Housing in the Last Year: No    Number of Times Moved in the Last Year: 0    Homeless in the Last Year: No  Utilities: Not At Risk (04/16/2024)   Epic    Threatened with loss of utilities: No  Health Literacy: Not on file    Review of Systems Per HPI  Objective:  BP 119/80   Pulse (!) 112   Ht 5' 9 (1.753 m)   Wt 229 lb 3.2 oz (104 kg)   LMP 03/26/2019   SpO2 95%   BMI 33.85 kg/m      11/21/2024    3:08 PM 08/03/2024    1:13 PM 05/21/2024   11:08 AM   BP/Weight  Systolic BP 119 119 124  Diastolic BP 80 80 76  Wt. (Lbs) 229.2 230.04 223.2  BMI 33.85 kg/m2 33.97 kg/m2 32.96 kg/m2    Physical Exam Vitals and nursing note reviewed.  Constitutional:      General: She is not in acute distress.    Appearance: Normal appearance.  HENT:     Head: Normocephalic and atraumatic.  Eyes:     General:        Right eye: No discharge.        Left eye: No discharge.     Conjunctiva/sclera: Conjunctivae normal.  Cardiovascular:     Rate and Rhythm: Normal rate and regular rhythm.  Pulmonary:     Effort: Pulmonary effort is normal.     Breath sounds: Normal breath sounds. No wheezing, rhonchi or rales.  Neurological:     Mental Status: She is alert.  Psychiatric:        Mood and Affect: Mood normal.        Behavior: Behavior normal.     Lab Results  Component Value Date   WBC 9.4 05/14/2024   HGB 13.7 05/14/2024   HCT 44.3 05/14/2024   PLT 331 05/14/2024   GLUCOSE 131 (H) 05/14/2024   CHOL 115 02/28/2024   TRIG 150 (H) 02/28/2024   HDL 37 (L) 02/28/2024   LDLCALC 52 02/28/2024   ALT 12 05/14/2024   AST 17 05/14/2024   NA 136 05/14/2024   K 3.4 (L) 05/14/2024   CL 102 05/14/2024   CREATININE 1.10 (H) 05/14/2024   BUN 15 05/14/2024   CO2 23 05/14/2024   TSH 1.860 01/14/2021   HGBA1C 6.7 (H) 02/28/2024   MICROALBUR 1.0 05/11/2017     Assessment & Plan:  Essential hypertension Assessment & Plan: Stable. Continue Losartan  and Amlodipine .  Orders: -     amLODIPine  Besylate; TAKE 1 TABLET(10 MG) BY MOUTH DAILY  Dispense: 90 tablet; Refill: 3 -     Losartan  Potassium; Take 1 tablet (50 mg total) by mouth daily.  Dispense: 90 tablet; Refill: 3  B12 deficiency -     CBC -     Vitamin B12  Iron deficiency -     Iron, TIBC and Ferritin Panel  Hyperlipidemia, unspecified hyperlipidemia type Assessment & Plan: At goal. Lipid panel ordered. Continue Crestor .   Orders: -     Lipid panel -     Empagliflozin ; Take 1  tablet (10 mg  total) by mouth daily.  Dispense: 90 tablet; Refill: 3 -     Fenofibrate ; TAKE 1 TABLET(160 MG) BY MOUTH DAILY  Dispense: 90 tablet; Refill: 3 -     Rosuvastatin  Calcium ; Take 1 tablet (10 mg total) by mouth daily.  Dispense: 90 tablet; Refill: 3  Type 2 diabetes mellitus without complication, without long-term current use of insulin  (HCC) Assessment & Plan: Has been at goal. A1c ordered. Continue current medications.  Orders: -     CMP14+EGFR -     Hemoglobin A1c -     Microalbumin / creatinine urine ratio -     glipiZIDE ; TAKE 1 TABLET(5 MG) BY MOUTH DAILY  Dispense: 90 tablet; Refill: 3    Follow-up:  6 months  Rodolfo Notaro Bluford DO [facility] "

## 2025-07-22 ENCOUNTER — Ambulatory Visit: Admitting: Family Medicine
